# Patient Record
Sex: Male | Born: 1985 | Race: Black or African American | Hispanic: No | Marital: Single | Smoking: Current every day smoker
Health system: Southern US, Community
[De-identification: ages and names within clinical notes are randomized; demographics above are authoritative.]

## PROBLEM LIST (undated history)

## (undated) ENCOUNTER — Emergency Department (HOSPITAL_BASED_OUTPATIENT_CLINIC_OR_DEPARTMENT_OTHER): Payer: Self-pay

## (undated) DIAGNOSIS — R079 Chest pain, unspecified: Secondary | ICD-10-CM

## (undated) DIAGNOSIS — I10 Essential (primary) hypertension: Secondary | ICD-10-CM

## (undated) HISTORY — DX: Chest pain, unspecified: R07.9

## (undated) HISTORY — PX: CIRCUMCISION: SUR203

---

## 2011-01-31 ENCOUNTER — Emergency Department (INDEPENDENT_AMBULATORY_CARE_PROVIDER_SITE_OTHER): Payer: Self-pay

## 2011-01-31 ENCOUNTER — Emergency Department (HOSPITAL_BASED_OUTPATIENT_CLINIC_OR_DEPARTMENT_OTHER)
Admission: EM | Admit: 2011-01-31 | Discharge: 2011-01-31 | Disposition: A | Discharge status: Home / Self Care | Payer: Self-pay | Attending: Emergency Medicine | Admitting: Emergency Medicine

## 2011-01-31 ENCOUNTER — Encounter: Payer: Self-pay | Admitting: *Deleted

## 2011-01-31 DIAGNOSIS — S022XXA Fracture of nasal bones, initial encounter for closed fracture: Secondary | ICD-10-CM | POA: Insufficient documentation

## 2011-01-31 DIAGNOSIS — J3489 Other specified disorders of nose and nasal sinuses: Secondary | ICD-10-CM

## 2011-01-31 DIAGNOSIS — IMO0002 Reserved for concepts with insufficient information to code with codable children: Secondary | ICD-10-CM | POA: Insufficient documentation

## 2011-01-31 DIAGNOSIS — X58XXXA Exposure to other specified factors, initial encounter: Secondary | ICD-10-CM

## 2011-01-31 DIAGNOSIS — F17200 Nicotine dependence, unspecified, uncomplicated: Secondary | ICD-10-CM | POA: Insufficient documentation

## 2011-01-31 NOTE — ED Provider Notes (Addendum)
History     CSN: [id number] Arrival date & time: 01/31/2011  2:01 PM   First MD Initiated Contact with Patient 01/31/11 1426      Chief Complaint  Patient presents with  . Facial Injury    (Consider location/radiation/quality/duration/timing/severity/associated sxs/prior treatment) HPI Patient hit in nose with elbow yesterday.  NO loc.  Patient states nose broken before but now with deformity.  No other injury.   History reviewed. No pertinent past medical history.  Past Surgical History  Procedure Date  . Circumcision     History reviewed. No pertinent family history.  History  Substance Use Topics  . Smoking status: Current Everyday Smoker  . Smokeless tobacco: Not on file  . Alcohol Use: No      Review of Systems  All other systems reviewed and are negative.    Allergies  Review of patient's allergies indicates no known allergies.  Home Medications  No current outpatient prescriptions on file.  BP 134/77  Pulse 84  Temp(Src) 98 F (36.7 C) (Oral)  Resp 18  Ht 5\' 7"  (1.702 m)  Wt 160 lb (72.576 kg)  BMI 25.06 kg/m2  SpO2 98%  Physical Exam  Constitutional: He is oriented to person, place, and time. He appears well-developed and well-nourished.  HENT:  Head: Normocephalic.  Right Ear: External ear normal.  Left Ear: External ear normal.       Deformity to nose, no septal hematomas  Eyes: Conjunctivae and EOM are normal. Pupils are equal, round, and reactive to light.  Neck: Normal range of motion. Neck supple.  Cardiovascular: Normal rate and regular rhythm.   Abdominal: Soft. Bowel sounds are normal.  Musculoskeletal: Normal range of motion.  Neurological: He is alert and oriented to person, place, and time. He has normal reflexes.  Skin: Skin is warm and dry.  Psychiatric: He has a normal mood and affect.    ED Course  Procedures (including critical care time)  Labs Reviewed - No data to display No results found.   No diagnosis  found.    MDM  Patient not in room "out smoking" per staff       Hilario Quarry, MD 01/31/11 1506  Hilario Quarry, MD 01/31/11 [phone]

## 2011-01-31 NOTE — ED Notes (Signed)
Care plan use of ice and tylenol for pain reviewed

## 2011-01-31 NOTE — ED Notes (Signed)
Pt states he got elbowed in the nose yesterday. No LOC. Bled then, but none today. Now c/o increased pain. Obvious deformity noted.

## 2015-11-03 ENCOUNTER — Emergency Department (HOSPITAL_BASED_OUTPATIENT_CLINIC_OR_DEPARTMENT_OTHER): Payer: Self-pay

## 2015-11-03 ENCOUNTER — Encounter (HOSPITAL_BASED_OUTPATIENT_CLINIC_OR_DEPARTMENT_OTHER): Payer: Self-pay | Admitting: Emergency Medicine

## 2015-11-03 ENCOUNTER — Emergency Department (HOSPITAL_BASED_OUTPATIENT_CLINIC_OR_DEPARTMENT_OTHER)
Admission: EM | Admit: 2015-11-03 | Discharge: 2015-11-03 | Disposition: A | Discharge status: Home / Self Care | Payer: Self-pay | Attending: Emergency Medicine | Admitting: Emergency Medicine

## 2015-11-03 DIAGNOSIS — R0789 Other chest pain: Secondary | ICD-10-CM | POA: Insufficient documentation

## 2015-11-03 DIAGNOSIS — F1290 Cannabis use, unspecified, uncomplicated: Secondary | ICD-10-CM | POA: Insufficient documentation

## 2015-11-03 DIAGNOSIS — F17200 Nicotine dependence, unspecified, uncomplicated: Secondary | ICD-10-CM | POA: Insufficient documentation

## 2015-11-03 DIAGNOSIS — I10 Essential (primary) hypertension: Secondary | ICD-10-CM | POA: Insufficient documentation

## 2015-11-03 HISTORY — DX: Essential (primary) hypertension: I10

## 2015-11-03 LAB — CBC
HEMATOCRIT: 43.2 % (ref 39.0–52.0)
HEMOGLOBIN: 15.3 g/dL (ref 13.0–17.0)
MCH: 30.8 pg (ref 26.0–34.0)
MCHC: 35.4 g/dL (ref 30.0–36.0)
MCV: 87.1 fL (ref 78.0–100.0)
Platelets: 197 10*3/uL (ref 150–400)
RBC: 4.96 MIL/uL (ref 4.22–5.81)
RDW: 14.2 % (ref 11.5–15.5)
WBC: 8.5 10*3/uL (ref 4.0–10.5)

## 2015-11-03 LAB — BASIC METABOLIC PANEL
ANION GAP: 8 (ref 5–15)
BUN: 19 mg/dL (ref 6–20)
CALCIUM: 9.1 mg/dL (ref 8.9–10.3)
CHLORIDE: 104 mmol/L (ref 101–111)
CO2: 25 mmol/L (ref 22–32)
Creatinine, Ser: 0.95 mg/dL (ref 0.61–1.24)
GFR calc Af Amer: >60 mL/min (ref >60)
GFR calc non Af Amer: >60 mL/min (ref >60)
GLUCOSE: 95 mg/dL (ref 65–99)
Potassium: 3.6 mmol/L (ref 3.5–5.1)
Sodium: 137 mmol/L (ref 135–145)

## 2015-11-03 LAB — TROPONIN I

## 2015-11-03 NOTE — ED Provider Notes (Addendum)
[facility]   CSN: [id number] Arrival date & time: 11/03/15  [id number]     History   Chief Complaint Chief Complaint  Patient presents with  . Chest Pain    HPI Pedro Hebert is a 30 y.o. male.Complains of left-sided anterior chest pain onset 1 month ago pain is constant with, waxes and wanes, made worse with smoking or with exercise not improved by anything. Presently pain is mild, pleuritic in quality. Nonradiating. Associated symptoms include generalized weakness and shortness of breath. No other associated symptoms. No treatment prior to coming here.  HPI  Past Medical History:  Diagnosis Date  . Hypertension   Denies history of hypertension  There are no active problems to display for this patient.   Past Surgical History:  Procedure Laterality Date  . CIRCUMCISION         Home Medications    Prior to Admission medications   Not on File    Family History No family history on file.  Social History Social History  Substance Use Topics  . Smoking status: Current Every Day Smoker  . Smokeless tobacco: Not on file  . Alcohol use Yes     Comment: occasional   Denies illicit drug use  Allergies   Review of patient's allergies indicates no known allergies.   Review of Systems Review of Systems  Constitutional: Negative.   HENT: Negative.   Respiratory: Positive for shortness of breath.   Cardiovascular: Positive for chest pain.  Gastrointestinal: Negative.   Musculoskeletal: Negative.   Skin: Negative.   Neurological: Positive for light-headedness.  Psychiatric/Behavioral: Negative.   All other systems reviewed and are negative.    Physical Exam Updated Vital Signs BP 118/84   Pulse (!) 51   Temp 97.9 F (36.6 C) (Oral)   Resp 15   Ht 5\' 8"  (1.727 m)   Wt 140 lb (63.5 kg)   SpO2 99%   BMI 21.29 kg/m   Physical Exam  Constitutional: He appears well-developed and well-nourished. No distress.  Bradycardic    HENT:  Head: Normocephalic and atraumatic.  Eyes: Conjunctivae are normal. Pupils are equal, round, and reactive to light.  Neck: Neck supple. No tracheal deviation present. No thyromegaly present.  Cardiovascular: Regular rhythm and intact distal pulses.   No murmur heard. Pulmonary/Chest: Effort normal and breath sounds normal.  Abdominal: Soft. Bowel sounds are normal. He exhibits no distension. There is no tenderness.  Musculoskeletal: Normal range of motion. He exhibits no edema or tenderness.  Neurological: He is alert. Coordination normal.  Skin: Skin is warm and dry. No rash noted.  Psychiatric: He has a normal mood and affect. His behavior is normal.  Nursing note and vitals reviewed.    ED Treatments / Results  Labs (all labs ordered are listed, but only abnormal results are displayed) Labs Reviewed  BASIC METABOLIC PANEL  CBC  TROPONIN I    EKG  EKG Interpretation  Date/Time:  Monday November 03 2015 04:57:23 EDT Ventricular Rate:  50 PR Interval:    QRS Duration: 112 QT Interval:  420 QTC Calculation: 383 R Axis:   14 Text Interpretation:  Sinus rhythm Incomplete right bundle branch block ST elev, probable normal early repol pattern No previous ECGs available Confirmed by Bebe ShaggyWICKLINE  MD, DONALD ([id number]) on 11/03/2015 5:02:31 AM       Radiology Dg Chest 2 View  Result Date: 11/03/2015 CLINICAL DATA:  Chest pain.  Shortness of breath. EXAM: CHEST  2 VIEW COMPARISON:  Chest radiograph 11/04/2014 FINDINGS: Cardiomediastinal contours are normal. No pneumothorax or sizable pleural effusion. No focal airspace consolidation or pulmonary edema. IMPRESSION: Clear lungs. Electronically Signed   By: Deatra RobinsonKevin  Herman M.D.   On: 11/03/2015 05:36    Procedures Procedures (including critical care time)  Medications Ordered in ED Medications - No data to display  Chest x-ray viewed by me. Results for orders placed or performed during the hospital encounter of 11/03/15  Basic  metabolic panel  Result Value Ref Range   Sodium 137 135 - 145 mmol/L   Potassium 3.6 3.5 - 5.1 mmol/L   Chloride 104 101 - 111 mmol/L   CO2 25 22 - 32 mmol/L   Glucose, Bld 95 65 - 99 mg/dL   BUN 19 6 - 20 mg/dL   Creatinine, Ser 1.610.95 0.61 - 1.24 mg/dL   Calcium 9.1 8.9 - 09.610.3 mg/dL   GFR calc non Af Amer >60 >60 mL/min   GFR calc Af Amer >60 >60 mL/min   Anion gap 8 5 - 15  CBC  Result Value Ref Range   WBC 8.5 4.0 - 10.5 K/uL   RBC 4.96 4.22 - 5.81 MIL/uL   Hemoglobin 15.3 13.0 - 17.0 g/dL   HCT 04.543.2 40.939.0 - 81.152.0 %   MCV 87.1 78.0 - 100.0 fL   MCH 30.8 26.0 - 34.0 pg   MCHC 35.4 30.0 - 36.0 g/dL   RDW 91.414.2 78.211.5 - 95.615.5 %   Platelets 197 150 - 400 K/uL  Troponin I  Result Value Ref Range   Troponin I <0.03 <0.03 ng/mL   Dg Chest 2 View  Result Date: 11/03/2015 CLINICAL DATA:  Chest pain.  Shortness of breath. EXAM: CHEST  2 VIEW COMPARISON:  Chest radiograph 11/04/2014 FINDINGS: Cardiomediastinal contours are normal. No pneumothorax or sizable pleural effusion. No focal airspace consolidation or pulmonary edema. IMPRESSION: Clear lungs. Electronically Signed   By: Deatra RobinsonKevin  Herman M.D.   On: 11/03/2015 05:36   Initial Impression / Assessment and Plan / ED Course  I have reviewed the triage vital signs and the nursing notes.  Pertinent labs & imaging results that were available during my care of the patient were reviewed by me and considered in my medical decision making (see chart for details).  Clinical Course   Heart score equals 2. Doubt acute coronary syndrome with atypical symptoms, one month of symptoms, constant. Symptoms highly atypical for aortic dissection. Symmetric bilateral radial pulses. Normal chest x-ray. Doubt pulmonary embolism. Atypical symptoms after one month. Counseled patient for 5 minutes on smoking cessation plan referral [facility].   Final Clinical Impressions(s) / ED Diagnoses  Diagnosis atypical  chest pain Final diagnoses:  Atypical chest pain    New Prescriptions New Prescriptions   No medications on file     Doug SouSam Bobbi Yount, MD 11/03/15 [id number]    Doug SouSam Taylor Levick, MD 11/03/15 [phone]

## 2015-11-03 NOTE — ED Triage Notes (Signed)
Pt states chest pain intermittently x 1 month. Pt is a smoker. Shortness of breath, lightheadedness and weakness associated with chest pain. Pain 8-9/10 on arrival.

## 2015-11-03 NOTE — Discharge Instructions (Signed)
Call the [facility] today to arrange to get a primary care physician. Ask your new doctor to help you to stop smoking. Call the [facility] for an appointment if you wish to see a cardiologist. Take Tylenol or Advil as directed for pain

## 2015-12-09 ENCOUNTER — Emergency Department (HOSPITAL_BASED_OUTPATIENT_CLINIC_OR_DEPARTMENT_OTHER): Payer: Self-pay

## 2015-12-09 ENCOUNTER — Emergency Department (HOSPITAL_BASED_OUTPATIENT_CLINIC_OR_DEPARTMENT_OTHER)
Admission: EM | Admit: 2015-12-09 | Discharge: 2015-12-09 | Disposition: A | Discharge status: Home / Self Care | Payer: Self-pay | Attending: Emergency Medicine | Admitting: Emergency Medicine

## 2015-12-09 ENCOUNTER — Encounter (HOSPITAL_BASED_OUTPATIENT_CLINIC_OR_DEPARTMENT_OTHER): Payer: Self-pay | Admitting: Emergency Medicine

## 2015-12-09 ENCOUNTER — Ambulatory Visit: Payer: Self-pay | Admitting: Interventional Cardiology

## 2015-12-09 DIAGNOSIS — F17200 Nicotine dependence, unspecified, uncomplicated: Secondary | ICD-10-CM | POA: Insufficient documentation

## 2015-12-09 DIAGNOSIS — M79672 Pain in left foot: Secondary | ICD-10-CM | POA: Insufficient documentation

## 2015-12-09 DIAGNOSIS — I10 Essential (primary) hypertension: Secondary | ICD-10-CM | POA: Insufficient documentation

## 2015-12-09 MED ORDER — IBUPROFEN 400 MG PO TABS
600.0000 mg | ORAL_TABLET | Freq: Once | ORAL | Status: AC
  Administered 2015-12-09: 600 mg via ORAL
  Filled 2015-12-09: qty 1

## 2015-12-09 NOTE — ED Triage Notes (Signed)
Pt c/o left foot pain since this morning, denies any injury

## 2015-12-09 NOTE — ED Provider Notes (Signed)
[facility]   CSN: [id number] Arrival date & time: 12/09/15  1431     History   Chief Complaint Chief Complaint  Patient presents with  . Foot Pain    HPI Pedro Hebert is a 30 y.o. male.  HPI  30 year old male presents with atraumatic left foot pain. Woke up with foot pain yesterday morning. The day before he did not remember doing anything out of the ordinary or any type of exercise or anything that could've injured him. The pain is in the plantar aspect of his foot. It is distal to his heel. Patient has not had any numbness or weakness. He has tried ibuprofen and Advil without relief. Has also tried ice and hot packs. When he palpates the plantar aspect of his foot it feels swollen to him. No ankle pain or swelling.  Past Medical History:  Diagnosis Date  . Chest pain   . Hypertension     There are no active problems to display for this patient.   Past Surgical History:  Procedure Laterality Date  . CIRCUMCISION         Home Medications    Prior to Admission medications   Not on File    Family History Family History  Problem Relation Age of Onset  . Other Maternal Grandfather     LEG  AMBULATION    Social History Social History  Substance Use Topics  . Smoking status: Current Every Day Smoker  . Smokeless tobacco: Never Used  . Alcohol use Yes     Comment: occasional, DRINK WINE ,BEER AND LIQUOR     Allergies   Review of patient's allergies indicates no known allergies.   Review of Systems Review of Systems  Musculoskeletal: Positive for arthralgias and joint swelling.  Skin: Negative for color change and wound.  Neurological: Negative for weakness and numbness.  All other systems reviewed and are negative.    Physical Exam Updated Vital Signs BP 99/80 (BP Location: Right Arm)   Pulse 88   Temp 98.1 F (36.7 C) (Oral)   Resp 18   Ht 5\' 8"  (1.727 m)   Wt 145 lb (65.8 kg)   SpO2 100%   BMI 22.05 kg/m    Physical Exam  Constitutional: He is oriented to person, place, and time. He appears well-developed and well-nourished. No distress.  HENT:  Head: Normocephalic and atraumatic.  Right Ear: External ear normal.  Left Ear: External ear normal.  Nose: Nose normal.  Eyes: Right eye exhibits no discharge. Left eye exhibits no discharge.  Cardiovascular: Normal rate and regular rhythm.   Pulses:      Dorsalis pedis pulses are 2+ on the left side.  Pulmonary/Chest: Effort normal.  Abdominal: He exhibits no distension.  Musculoskeletal: He exhibits no edema.       Left ankle: He exhibits normal range of motion. No tenderness.       Left foot: There is tenderness. There is no bony tenderness and no swelling.       Feet:  Neurological: He is alert and oriented to person, place, and time.  Skin: Skin is warm and dry. He is not diaphoretic.  Nursing note and vitals reviewed.    ED Treatments / Results  Labs (all labs ordered are listed, but only abnormal results are displayed) Labs Reviewed - No data to display  EKG  EKG Interpretation None       Radiology Dg Foot Complete Left  Result Date: 12/09/2015 CLINICAL DATA:  Plantar pain since yesterday EXAM: LEFT FOOT - COMPLETE 3+ VIEW COMPARISON:  None. FINDINGS: There is no evidence of fracture or dislocation. No calcaneal spur. There is no evidence of arthropathy or other focal bone abnormality. Soft tissues are unremarkable. IMPRESSION: Negative. Electronically Signed   By: Corlis Leak M.D.   On: 12/09/2015 15:26    Procedures Procedures (including critical care time)  Medications Ordered in ED Medications  ibuprofen (ADVIL,MOTRIN) tablet 600 mg (600 mg Oral Given 12/09/15 1527)     Initial Impression / Assessment and Plan / ED Course  I have reviewed the triage vital signs and the nursing notes.  Pertinent labs & imaging results that were available during my care of the patient were reviewed by me and considered in my  medical decision making (see chart for details).  Clinical Course    Xray benign. NV intact. Tendon/ligament issue? I do not feel obvious infection/abscess. Maybe plantar fascitis. Will place in post-op shoe, continue NSAIDs, tylenol. F/u with Dr. Pearletha Forge.   Final Clinical Impressions(s) / ED Diagnoses   Final diagnoses:  Acute foot pain, left    New Prescriptions New Prescriptions   No medications on file     Pricilla Loveless, MD 12/09/15 1559

## 2015-12-09 NOTE — ED Notes (Signed)
Post op shoe placed on left foot

## 2016-01-24 ENCOUNTER — Emergency Department (HOSPITAL_BASED_OUTPATIENT_CLINIC_OR_DEPARTMENT_OTHER)
Admission: EM | Admit: 2016-01-24 | Discharge: 2016-01-24 | Disposition: A | Discharge status: Home / Self Care | Payer: Self-pay | Attending: Emergency Medicine | Admitting: Emergency Medicine

## 2016-01-24 ENCOUNTER — Encounter (HOSPITAL_BASED_OUTPATIENT_CLINIC_OR_DEPARTMENT_OTHER): Payer: Self-pay | Admitting: *Deleted

## 2016-01-24 ENCOUNTER — Emergency Department (HOSPITAL_BASED_OUTPATIENT_CLINIC_OR_DEPARTMENT_OTHER): Payer: Self-pay

## 2016-01-24 DIAGNOSIS — F17200 Nicotine dependence, unspecified, uncomplicated: Secondary | ICD-10-CM | POA: Insufficient documentation

## 2016-01-24 DIAGNOSIS — R059 Cough, unspecified: Secondary | ICD-10-CM

## 2016-01-24 DIAGNOSIS — I10 Essential (primary) hypertension: Secondary | ICD-10-CM | POA: Insufficient documentation

## 2016-01-24 DIAGNOSIS — R0982 Postnasal drip: Secondary | ICD-10-CM

## 2016-01-24 DIAGNOSIS — R05 Cough: Secondary | ICD-10-CM | POA: Insufficient documentation

## 2016-01-24 MED ORDER — GUAIFENESIN ER 600 MG PO TB12: 600.0000 mg | ORAL_TABLET | Freq: Two times a day (BID) | ORAL | 0 refills | Status: DC

## 2016-01-24 MED ORDER — BENZONATATE 100 MG PO CAPS
200.0000 mg | ORAL_CAPSULE | Freq: Once | ORAL | Status: AC
  Administered 2016-01-24: 200 mg via ORAL
  Filled 2016-01-24: qty 2

## 2016-01-24 MED ORDER — BENZONATATE 100 MG PO CAPS: 100.0000 mg | ORAL_CAPSULE | Freq: Three times a day (TID) | ORAL | 0 refills | Status: DC

## 2016-01-24 MED ORDER — ALBUTEROL SULFATE HFA 108 (90 BASE) MCG/ACT IN AERS: 1.0000 | INHALATION_SPRAY | Freq: Four times a day (QID) | RESPIRATORY_TRACT | 0 refills | Status: DC | PRN

## 2016-01-24 MED ORDER — PREDNISONE 20 MG PO TABS: ORAL_TABLET | ORAL | 0 refills | Status: DC

## 2016-01-24 MED ORDER — PREDNISONE 50 MG PO TABS
60.0000 mg | ORAL_TABLET | Freq: Once | ORAL | Status: AC
  Administered 2016-01-24: 60 mg via ORAL
  Filled 2016-01-24: qty 1

## 2016-01-24 MED ORDER — GUAIFENESIN 100 MG/5ML PO SOLN
5.0000 mL | Freq: Once | ORAL | Status: AC
  Administered 2016-01-24: 100 mg via ORAL
  Filled 2016-01-24: qty 5

## 2016-01-24 NOTE — ED Triage Notes (Signed)
C/o productive x 4 weeks w cp w cough

## 2016-01-24 NOTE — ED Provider Notes (Signed)
[facility]   CSN: [id number] Arrival date & time: 01/24/16  0433     History   Chief Complaint Chief Complaint  Patient presents with  . Cough    HPI Pedro Hebert is a 30 y.o. male.  The history is provided by the patient.  Cough  This is a new problem. The current episode started more than 1 week ago. Episode frequency: intermittently. The problem has not changed since onset.There has been no fever. Associated symptoms include rhinorrhea. Pertinent negatives include no chest pain, no chills, no sweats, no weight loss, no headaches, no sore throat, no myalgias, no shortness of breath and no wheezing. Treatments tried: dayquil. The treatment provided no relief. He is a smoker.    Past Medical History:  Diagnosis Date  . Chest pain   . Hypertension     There are no active problems to display for this patient.   Past Surgical History:  Procedure Laterality Date  . CIRCUMCISION         Home Medications    Prior to Admission medications   Not on File    Family History Family History  Problem Relation Age of Onset  . Other Maternal Grandfather     LEG  AMBULATION    Social History Social History  Substance Use Topics  . Smoking status: Current Every Day Smoker  . Smokeless tobacco: Never Used  . Alcohol use Yes     Comment: occasional, DRINK WINE ,BEER AND LIQUOR     Allergies   Patient has no known allergies.   Review of Systems Review of Systems  Constitutional: Negative for chills, fever and weight loss.  HENT: Positive for congestion and rhinorrhea. Negative for sore throat.   Respiratory: Positive for cough. Negative for shortness of breath and wheezing.   Cardiovascular: Negative for chest pain, palpitations and leg swelling.  Gastrointestinal: Negative for abdominal pain.  Musculoskeletal: Negative for myalgias.  Neurological: Negative for headaches.  All other systems reviewed and are  negative.    Physical Exam Updated Vital Signs BP 114/69 (BP Location: Right Arm)   Pulse 61   Temp 97.5 F (36.4 C) (Oral)   Resp 20   Ht 5\' 8"  (1.727 m)   Wt 145 lb (65.8 kg)   SpO2 98%   BMI 22.05 kg/m   Physical Exam  Constitutional: He is oriented to person, place, and time. He appears well-developed and well-nourished. No distress.  HENT:  Head: Normocephalic and atraumatic.  Right Ear: External ear normal.  Left Ear: External ear normal.  Clear colorless postnasal drip  Eyes: EOM are normal. Pupils are equal, round, and reactive to light.  Neck: Normal range of motion. Neck supple. No JVD present.  Cardiovascular: Normal rate, regular rhythm and intact distal pulses.   Pulmonary/Chest: Effort normal and breath sounds normal. No stridor. No respiratory distress. He has no wheezes. He has no rales.  Abdominal: Soft. Bowel sounds are normal. He exhibits no mass. There is no tenderness. There is no rebound and no guarding.  Musculoskeletal: Normal range of motion.  Neurological: He is alert and oriented to person, place, and time.  Skin: Skin is warm and dry. Capillary refill takes less than 2 seconds.  Psychiatric: He has a normal mood and affect.     ED Treatments / Results   Radiology No results found.  Procedures Procedures (including critical care time)  Medications Ordered in ED Medications  benzonatate (TESSALON) capsule 200 mg (not administered)  predniSONE (DELTASONE) tablet 60 mg (not administered)  guaiFENesin (ROBITUSSIN) 100 MG/5ML solution 100 mg (not administered)    Final Clinical Impressions(s) / ED Diagnoses   Lungs are clear, vitals are normal.  Some of cough may be post nasal drip but the majority is related to the patient's smoking.  He has been counseled to stop this.  We will treat symptomatically. All questions answered to patient's satisfaction. Based on history and exam patient has been appropriately medically screened and emergency  conditions excluded. Patient is stable for discharge at this time. Follow up with your PMD for recheck in 2 days and strict return precautions given.  New Prescriptions New Prescriptions   No medications on file     Dhani Imel, MD 01/24/16 0502

## 2020-07-26 ENCOUNTER — Other Ambulatory Visit: Payer: Self-pay

## 2020-07-26 ENCOUNTER — Encounter (HOSPITAL_BASED_OUTPATIENT_CLINIC_OR_DEPARTMENT_OTHER): Payer: Self-pay

## 2020-07-26 DIAGNOSIS — I10 Essential (primary) hypertension: Secondary | ICD-10-CM | POA: Insufficient documentation

## 2020-07-26 DIAGNOSIS — F17200 Nicotine dependence, unspecified, uncomplicated: Secondary | ICD-10-CM | POA: Insufficient documentation

## 2020-07-26 DIAGNOSIS — K0889 Other specified disorders of teeth and supporting structures: Secondary | ICD-10-CM | POA: Insufficient documentation

## 2020-07-26 NOTE — ED Triage Notes (Signed)
Reports left bottom dental pain x3 days. States the whole left side of his face and neck hurt. Excedrin this am, advil pm at 1800.

## 2020-07-27 ENCOUNTER — Emergency Department (HOSPITAL_BASED_OUTPATIENT_CLINIC_OR_DEPARTMENT_OTHER)
Admission: EM | Admit: 2020-07-27 | Discharge: 2020-07-27 | Disposition: A | Discharge status: Home / Self Care | Payer: Self-pay | Attending: Emergency Medicine | Admitting: Emergency Medicine

## 2020-07-27 DIAGNOSIS — K0889 Other specified disorders of teeth and supporting structures: Secondary | ICD-10-CM

## 2020-07-27 MED ORDER — AMOXICILLIN 500 MG PO CAPS
500.0000 mg | ORAL_CAPSULE | Freq: Once | ORAL | Status: AC
  Administered 2020-07-27: 500 mg via ORAL
  Filled 2020-07-27: qty 1

## 2020-07-27 MED ORDER — AMOXICILLIN 500 MG PO CAPS: 500.0000 mg | ORAL_CAPSULE | Freq: Three times a day (TID) | ORAL | 0 refills | Status: AC

## 2020-07-27 MED ORDER — OXYCODONE-ACETAMINOPHEN 5-325 MG PO TABS: 1.0000 | ORAL_TABLET | Freq: Four times a day (QID) | ORAL | 0 refills | Status: DC | PRN

## 2020-07-27 MED ORDER — BUPIVACAINE-EPINEPHRINE (PF) 0.5% -1:200000 IJ SOLN
1.8000 mL | Freq: Once | INTRAMUSCULAR | Status: AC
  Administered 2020-07-27: 1.8 mL
  Filled 2020-07-27: qty 1.8

## 2020-07-27 NOTE — ED Provider Notes (Signed)
[facility] Provider Note: Lowella Dell, MD, FACEP  CSN: [id number] MRN: [id number] ARRIVAL: 07/26/20 at 2306 ROOM: [facility]01/[facility]01   CHIEF COMPLAINT  Dental Pain   HISTORY OF PRESENT ILLNESS  07/27/20 2:02 AM Pedro Hebert is a 35 y.o. male who has a fractured left lower first molar.  He developed pain in that tooth 3 days ago which has become severe.  He rates the pain as an 8 out of 10 and characterizes it as throbbing.  It is worse with eating or drinking.  He has taken Excedrin and Advil without relief.  Past Medical History:  Diagnosis Date  . Chest pain   . Hypertension     Past Surgical History:  Procedure Laterality Date  . CIRCUMCISION      Family History  Problem Relation Age of Onset  . Other Maternal Grandfather        LEG  AMBULATION    Social History   Tobacco Use  . Smoking status: Current Every Day Smoker  . Smokeless tobacco: Never Used  Substance Use Topics  . Alcohol use: Yes    Comment: occasional, DRINK WINE ,BEER AND LIQUOR  . Drug use: Yes    Types: Marijuana    Prior to Admission medications   Medication Sig Start Date End Date Taking? Authorizing Provider  amoxicillin (AMOXIL) 500 MG capsule Take 1 capsule (500 mg total) by mouth 3 (three) times daily. 07/27/20  Yes Clotiel Troop, MD  oxyCODONE-acetaminophen (PERCOCET) 5-325 MG tablet Take 1 tablet by mouth every 6 (six) hours as needed for severe pain. 07/27/20  Yes Geordie Nooney, MD  albuterol (PROVENTIL HFA;VENTOLIN HFA) 108 (90 Base) MCG/ACT inhaler Inhale 1-2 puffs into the lungs every 6 (six) hours as needed for wheezing or shortness of breath. 01/24/16 07/27/20  Palumbo, April, MD    Allergies Patient has no known allergies.   REVIEW OF SYSTEMS  Negative except as noted here or in the History of Present Illness.   PHYSICAL EXAMINATION  Initial Vital Signs Blood pressure (!) 139/104, pulse (!) 54, temperature 99.2 F (37.3 C), temperature source Oral, resp. rate 16,  height 5\' 7"  (1.702 m), weight 63.5 kg, SpO2 99 %.  Examination General: Well-developed, well-nourished male in obvious discomfort; appearance consistent with age of record HENT: normocephalic; atraumatic; fractured/carious left lower first molar with tenderness to percussion Eyes: pupils equal, round and reactive to light; extraocular muscles intact Neck: supple; mild left anterior cervical lymphadenopathy Heart: regular rate and rhythm Lungs: clear to auscultation bilaterally Abdomen: soft; nondistended; nontender; bowel sounds present Extremities: No deformity; full range of motion Neurologic: Awake, alert and oriented; motor function intact in all extremities and symmetric; no facial droop Skin: Warm and dry Psychiatric: Pacing; moaning   RESULTS  Summary of this visit's results, reviewed and interpreted by myself:   EKG Interpretation  Date/Time:    Ventricular Rate:    PR Interval:    QRS Duration:   QT Interval:    QTC Calculation:   R Axis:     Text Interpretation:        Laboratory Studies: No results found for this or any previous visit (from the past 24 hour(s)). Imaging Studies: No results found.  ED COURSE and MDM  Nursing notes, initial and subsequent vitals signs, including pulse oximetry, reviewed and interpreted by myself.  Vitals:   07/26/20 2320 07/26/20 2324  BP:  (!) 139/104  Pulse:  (!) 54  Resp:  16  Temp:  99.2 F (37.3  C)  TempSrc:  Oral  SpO2:  99%  Weight: 63.5 kg   Height: 5\' 7"  (1.702 m)    Medications  bupivacaine-epinephrine (MARCAINE W/ EPI) 0.5% -1:200000 injection 1.8 mL (has no administration in time range)  amoxicillin (AMOXIL) capsule 500 mg (has no administration in time range)    Will start patient on antibiotics and analgesics and refer to dentistry.  PROCEDURES  Procedures DENTAL BLOCK 1.8 milliliters of 0.5% bupivacaine with epinephrine were injected into the buccal fold adjacent to the left lower first molar. The  patient tolerated this well and there were no immediate complications.  Patient states adequate analgesia was not obtained.   ED DIAGNOSES     ICD-10-CM   1. Pain, dental  K08.89        , MD 07/27/20 [phone]

## 2020-09-29 ENCOUNTER — Other Ambulatory Visit: Payer: Self-pay

## 2020-09-29 ENCOUNTER — Encounter (HOSPITAL_BASED_OUTPATIENT_CLINIC_OR_DEPARTMENT_OTHER): Payer: Self-pay | Admitting: Emergency Medicine

## 2020-09-29 ENCOUNTER — Emergency Department (HOSPITAL_BASED_OUTPATIENT_CLINIC_OR_DEPARTMENT_OTHER)
Admission: EM | Admit: 2020-09-29 | Discharge: 2020-09-29 | Disposition: A | Discharge status: Home / Self Care | Payer: Self-pay | Attending: Emergency Medicine | Admitting: Emergency Medicine

## 2020-09-29 DIAGNOSIS — K029 Dental caries, unspecified: Secondary | ICD-10-CM | POA: Insufficient documentation

## 2020-09-29 DIAGNOSIS — F17200 Nicotine dependence, unspecified, uncomplicated: Secondary | ICD-10-CM | POA: Insufficient documentation

## 2020-09-29 DIAGNOSIS — I10 Essential (primary) hypertension: Secondary | ICD-10-CM | POA: Insufficient documentation

## 2020-09-29 MED ORDER — OXYCODONE-ACETAMINOPHEN 5-325 MG PO TABS
2.0000 | ORAL_TABLET | Freq: Once | ORAL | Status: DC
  Filled 2020-09-29: qty 2

## 2020-09-29 MED ORDER — CLINDAMYCIN HCL 150 MG PO CAPS
300.0000 mg | ORAL_CAPSULE | Freq: Once | ORAL | Status: AC
  Administered 2020-09-29: 300 mg via ORAL
  Filled 2020-09-29: qty 2

## 2020-09-29 MED ORDER — CLINDAMYCIN HCL 300 MG PO CAPS: 300.0000 mg | ORAL_CAPSULE | Freq: Four times a day (QID) | ORAL | 0 refills | Status: AC

## 2020-09-29 MED ORDER — OXYCODONE-ACETAMINOPHEN 5-325 MG PO TABS: 1.0000 | ORAL_TABLET | Freq: Four times a day (QID) | ORAL | 0 refills | Status: AC | PRN

## 2020-09-29 NOTE — ED Provider Notes (Signed)
[facility] EMERGENCY DEPARTMENT Provider Note   CSN: [id number] Arrival date & time: 09/29/20  0344     History Chief Complaint  Patient presents with   Dental Pain    Pedro Hebert is a 35 y.o. male.  Patient is a 35 year old male with history of hypertension.  He presents today with complaints of dental pain.  Patient's right lower rear molar began hurting 2 days ago.  He describes severe pain that is unrelieved with ibuprofen.  The history is provided by the patient.  Dental Pain Location:  Lower Lower teeth location:  32/RL 3rd molar and 31/RL 2nd molar Quality:  Throbbing Severity:  Severe Onset quality:  Sudden Duration:  2 days Timing:  Constant Progression:  Worsening Chronicity:  Recurrent Context: dental caries   Context: not abscess       Past Medical History:  Diagnosis Date   Chest pain    Hypertension     There are no problems to display for this patient.   Past Surgical History:  Procedure Laterality Date   CIRCUMCISION         Family History  Problem Relation Age of Onset   Other Maternal Grandfather        LEG  AMBULATION    Social History   Tobacco Use   Smoking status: Every Day   Smokeless tobacco: Never  Substance Use Topics   Alcohol use: Yes    Comment: occasional, DRINK WINE ,BEER AND LIQUOR   Drug use: Yes    Types: Marijuana    Home Medications Prior to Admission medications   Medication Sig Start Date End Date Taking? Authorizing Provider  amoxicillin (AMOXIL) 500 MG capsule Take 1 capsule (500 mg total) by mouth 3 (three) times daily. 07/27/20   Molpus, John, MD  oxyCODONE-acetaminophen (PERCOCET) 5-325 MG tablet Take 1 tablet by mouth every 6 (six) hours as needed for severe pain. 07/27/20   Molpus, John, MD  albuterol (PROVENTIL HFA;VENTOLIN HFA) 108 (90 Base) MCG/ACT inhaler Inhale 1-2 puffs into the lungs every 6 (six) hours as needed for wheezing or shortness of breath. 01/24/16 07/27/20  Palumbo,  April, MD    Allergies    Patient has no known allergies.  Review of Systems   Review of Systems  All other systems reviewed and are negative.  Physical Exam Updated Vital Signs BP (!) 157/112 (BP Location: Right Arm)   Pulse 81   Temp 98.3 F (36.8 C) (Oral)   Resp 20   Ht 5\' 7"  (1.702 m)   Wt 65.8 kg   SpO2 100%   BMI 22.71 kg/m   Physical Exam Vitals and nursing note reviewed.  Constitutional:      General: He is not in acute distress.    Appearance: Normal appearance. He is not ill-appearing.  HENT:     Head: Normocephalic and atraumatic.     Mouth/Throat:     Comments: The right lower second and third molars have caries.  There is surrounding gingival inflammation, but no obvious abscess. Pulmonary:     Effort: Pulmonary effort is normal.  Musculoskeletal:     Cervical back: Normal range of motion and neck supple. No rigidity or tenderness.  Lymphadenopathy:     Cervical: No cervical adenopathy.  Skin:    General: Skin is warm and dry.  Neurological:     Mental Status: He is alert.    ED Results / Procedures / Treatments   Labs (all labs ordered are listed, but only  abnormal results are displayed) Labs Reviewed - No data to display  EKG None  Radiology No results found.  Procedures Procedures   Medications Ordered in ED Medications  oxyCODONE-acetaminophen (PERCOCET/ROXICET) 5-325 MG per tablet 2 tablet (has no administration in time range)  clindamycin (CLEOCIN) capsule 300 mg (has no administration in time range)    ED Course  I have reviewed the triage vital signs and the nursing notes.  Pertinent labs & imaging results that were available during my care of the patient were reviewed by me and considered in my medical decision making (see chart for details).    MDM Rules/Calculators/A&P  Patient to be treated with clindamycin and short course of pain medication.  Will refer to dentistry.  Final Clinical Impression(s) / ED  Diagnoses Final diagnoses:  None    Rx / DC Orders ED Discharge Orders     None        Geoffery Lyons, MD 09/29/20 0403

## 2020-09-29 NOTE — ED Triage Notes (Signed)
Pt states intermittent dental pain on R side x 1 month. This episode started yesterday morning. Pt states known cracked tooth but no dentist. Pt drove himself to ED. Denies swelling to face or fevers.

## 2020-09-29 NOTE — ED Notes (Signed)
Pt unable to get ride, therefore declined pain medication, says he will pick up his prescribed pain medication; EDP aware

## 2020-09-29 NOTE — Discharge Instructions (Signed)
Begin taking clindamycin as prescribed and Percocet as prescribed as needed for pain.  Follow-up with dentistry.  The contact information for Dr. Mia Creek has been provided in this discharge summary for you to call and make these arrangements.

## 2020-09-29 NOTE — ED Notes (Addendum)
See EDP assessment 

## 2021-03-02 ENCOUNTER — Other Ambulatory Visit: Payer: Self-pay

## 2021-03-02 ENCOUNTER — Emergency Department (HOSPITAL_BASED_OUTPATIENT_CLINIC_OR_DEPARTMENT_OTHER)
Admission: EM | Admit: 2021-03-02 | Discharge: 2021-03-02 | Disposition: A | Discharge status: Home / Self Care | Payer: Self-pay | Attending: Emergency Medicine | Admitting: Emergency Medicine

## 2021-03-02 ENCOUNTER — Encounter (HOSPITAL_BASED_OUTPATIENT_CLINIC_OR_DEPARTMENT_OTHER): Payer: Self-pay

## 2021-03-02 DIAGNOSIS — R112 Nausea with vomiting, unspecified: Secondary | ICD-10-CM

## 2021-03-02 DIAGNOSIS — R1110 Vomiting, unspecified: Secondary | ICD-10-CM | POA: Insufficient documentation

## 2021-03-02 DIAGNOSIS — I10 Essential (primary) hypertension: Secondary | ICD-10-CM | POA: Insufficient documentation

## 2021-03-02 DIAGNOSIS — R1013 Epigastric pain: Secondary | ICD-10-CM | POA: Insufficient documentation

## 2021-03-02 DIAGNOSIS — F17200 Nicotine dependence, unspecified, uncomplicated: Secondary | ICD-10-CM | POA: Insufficient documentation

## 2021-03-02 MED ORDER — ONDANSETRON HCL 4 MG/2ML IJ SOLN
4.0000 mg | Freq: Once | INTRAMUSCULAR | Status: DC
  Filled 2021-03-02: qty 2

## 2021-03-02 MED ORDER — ONDANSETRON 4 MG PO TBDP
4.0000 mg | ORAL_TABLET | Freq: Once | ORAL | Status: AC
  Administered 2021-03-02: 20:00:00 4 mg via ORAL
  Filled 2021-03-02: qty 1

## 2021-03-02 MED ORDER — ONDANSETRON 4 MG PO TBDP: ORAL_TABLET | ORAL | 0 refills | Status: AC

## 2021-03-02 MED ORDER — SODIUM CHLORIDE 0.9 % IV BOLUS: 1000.0000 mL | Freq: Once | INTRAVENOUS | Status: DC

## 2021-03-02 NOTE — Discharge Instructions (Signed)
Take zofran for nausea.   See your doctor for follow up   Return to ER if you have worse abdominal pain, vomiting, fever

## 2021-03-02 NOTE — ED Triage Notes (Signed)
Pt states he vomited x 2 today at work and was set home-NAD-steady gait

## 2021-03-02 NOTE — ED Provider Notes (Signed)
[facility] EMERGENCY DEPARTMENT Provider Note   CSN: [id number] Arrival date & time: 03/02/21  1716     History Chief Complaint  Patient presents with   Vomiting    Pedro Hebert is a 35 y.o. male history of hypertension here presenting with vomiting.  Patient vomited twice when he was at work.  He was sent home.  He was told to come and be evaluated.  Has not eaten anything since he vomited.  Denies any fever.  Denies any sick contacts  The history is provided by the patient.      Past Medical History:  Diagnosis Date   Chest pain    Hypertension     There are no problems to display for this patient.   Past Surgical History:  Procedure Laterality Date   CIRCUMCISION         Family History  Problem Relation Age of Onset   Other Maternal Grandfather        LEG  AMBULATION    Social History   Tobacco Use   Smoking status: Every Day   Smokeless tobacco: Never  Vaping Use   Vaping Use: Never used  Substance Use Topics   Alcohol use: Yes    Comment: occ   Drug use: Not Currently    Types: Marijuana    Home Medications Prior to Admission medications   Medication Sig Start Date End Date Taking? Authorizing Provider  amoxicillin (AMOXIL) 500 MG capsule Take 1 capsule (500 mg total) by mouth 3 (three) times daily. 07/27/20   Molpus, John, MD  clindamycin (CLEOCIN) 300 MG capsule Take 1 capsule (300 mg total) by mouth 4 (four) times daily. X 7 days 09/29/20   Geoffery Lyons, MD  oxyCODONE-acetaminophen (PERCOCET) 5-325 MG tablet Take 1-2 tablets by mouth every 6 (six) hours as needed. 09/29/20   Geoffery Lyons, MD  albuterol (PROVENTIL HFA;VENTOLIN HFA) 108 (90 Base) MCG/ACT inhaler Inhale 1-2 puffs into the lungs every 6 (six) hours as needed for wheezing or shortness of breath. 01/24/16 07/27/20  Palumbo, April, MD    Allergies    Patient has no known allergies.  Review of Systems   Review of Systems  Gastrointestinal:  Positive for abdominal pain  and vomiting.  All other systems reviewed and are negative.  Physical Exam Updated Vital Signs BP 128/87 (BP Location: Left Arm)    Pulse 64    Temp 98 F (36.7 C) (Oral)    Resp 18    Ht 5\' 7"  (1.702 m)    Wt 67.6 kg    SpO2 100%    BMI 23.34 kg/m   Physical Exam Vitals and nursing note reviewed.  Constitutional:      Appearance: Normal appearance.  HENT:     Head: Normocephalic.     Nose: Nose normal.     Mouth/Throat:     Mouth: Mucous membranes are dry.  Eyes:     Extraocular Movements: Extraocular movements intact.     Pupils: Pupils are equal, round, and reactive to light.  Cardiovascular:     Rate and Rhythm: Normal rate and regular rhythm.     Pulses: Normal pulses.     Heart sounds: Normal heart sounds.  Pulmonary:     Effort: Pulmonary effort is normal.     Breath sounds: Normal breath sounds.  Abdominal:     General: Abdomen is flat.     Comments: Minimal epigastric tenderness  Musculoskeletal:        General: Normal  range of motion.     Cervical back: Normal range of motion and neck supple.  Skin:    General: Skin is warm.     Capillary Refill: Capillary refill takes less than 2 seconds.  Neurological:     General: No focal deficit present.     Mental Status: He is alert.  Psychiatric:        Mood and Affect: Mood normal.        Behavior: Behavior normal.    ED Results / Procedures / Treatments   Labs (all labs ordered are listed, but only abnormal results are displayed) Labs Reviewed - No data to display  EKG None  Radiology No results found.  Procedures Procedures   Medications Ordered in ED Medications  ondansetron (ZOFRAN-ODT) disintegrating tablet 4 mg (4 mg Oral Given 03/02/21 1938)    ED Course  I have reviewed the triage vital signs and the nursing notes.  Pertinent labs & imaging results that were available during my care of the patient were reviewed by me and considered in my medical decision making (see chart for details).     MDM Rules/Calculators/A&P                         Pedro Hebert is a 35 y.o. male here presenting with vomiting.  Likely viral gastroenteritis.  Ordered labs initially.  However patient wants to try Zofran and does not want to be stuck for labs. Will p.o. trial afterwards and reassess  8:21 PM Patient tolerated p.o. afterwards.  No epigastric tenderness now.  Likely viral gastro.  Stable for discharge     Final Clinical Impression(s) / ED Diagnoses Final diagnoses:  None    Rx / DC Orders ED Discharge Orders     None        Charlynne Pander, MD 03/02/21 2021

## 2021-04-05 ENCOUNTER — Other Ambulatory Visit: Payer: Self-pay

## 2021-04-05 ENCOUNTER — Emergency Department (HOSPITAL_BASED_OUTPATIENT_CLINIC_OR_DEPARTMENT_OTHER)
Admission: EM | Admit: 2021-04-05 | Discharge: 2021-04-05 | Disposition: A | Discharge status: Home / Self Care | Payer: BC Managed Care – PPO | Attending: Emergency Medicine | Admitting: Emergency Medicine

## 2021-04-05 DIAGNOSIS — I10 Essential (primary) hypertension: Secondary | ICD-10-CM | POA: Diagnosis not present

## 2021-04-05 DIAGNOSIS — J029 Acute pharyngitis, unspecified: Secondary | ICD-10-CM | POA: Diagnosis present

## 2021-04-05 DIAGNOSIS — Z20822 Contact with and (suspected) exposure to covid-19: Secondary | ICD-10-CM | POA: Insufficient documentation

## 2021-04-05 LAB — RESP PANEL BY RT-PCR (FLU A&B, COVID) ARPGX2
Influenza A by PCR: NEGATIVE
Influenza B by PCR: NEGATIVE
SARS Coronavirus 2 by RT PCR: NEGATIVE

## 2021-04-05 LAB — GROUP A STREP BY PCR: Group A Strep by PCR: NOT DETECTED

## 2021-04-05 NOTE — ED Triage Notes (Signed)
Pt came in with c/o sore throat starting last week. States it seemed like it got better for a bit but started hurting again. Pt states that he lost his taste and smell for approx one day.

## 2021-04-05 NOTE — Discharge Instructions (Addendum)
Return to ED with any new or worsening symptoms such as fever, chest pain, shortness of breath, drooling You may take 600 to 800 mg of ibuprofen every 6 hours for chills and body aches. You may take it as milligrams of Tylenol every 6 hours for fevers Please continue to hydrate yourself while sick.  Gatorade or Pedialyte are good options.

## 2021-04-05 NOTE — ED Provider Notes (Signed)
[facility] Provider Note   CSN: [id number] Arrival date & time: 04/05/21  2202     History  Chief Complaint  Patient presents with   Sore Throat    Pedro Hebert is a 36 y.o. male with medical history of hypertension.  Patient presents to the ED today due to sore throat that he began experiencing on Thursday.  Patient states that he has not been vaccinated against the flu this year but has recovered.  Patient states since Thursday sore throat is gotten progressively worse and he is now experiencing chills.  The patient states he has not attempted to alleviate his symptoms utilizing any over-the-counter medication.  Patient denies any known sick contacts.  Patient denies nausea, vomiting, fevers, shortness of breath, chest pain, abdominal pain, diarrhea.   Sore Throat Pertinent negatives include no chest pain, no abdominal pain and no shortness of breath.      Home Medications Prior to Admission medications   Medication Sig Start Date End Date Taking? Authorizing Provider  amoxicillin (AMOXIL) 500 MG capsule Take 1 capsule (500 mg total) by mouth 3 (three) times daily. 07/27/20   Molpus, John, MD  clindamycin (CLEOCIN) 300 MG capsule Take 1 capsule (300 mg total) by mouth 4 (four) times daily. X 7 days 09/29/20   Veryl Speak, MD  ondansetron (ZOFRAN-ODT) 4 MG disintegrating tablet 4mg  ODT q4 hours prn nausea/vomit 03/02/21   Drenda Freeze, MD  oxyCODONE-acetaminophen (PERCOCET) 5-325 MG tablet Take 1-2 tablets by mouth every 6 (six) hours as needed. 09/29/20   Veryl Speak, MD  albuterol (PROVENTIL HFA;VENTOLIN HFA) 108 (90 Base) MCG/ACT inhaler Inhale 1-2 puffs into the lungs every 6 (six) hours as needed for wheezing or shortness of breath. 01/24/16 07/27/20  Palumbo, April, MD      Allergies    Patient has no known allergies.    Review of Systems   Review of Systems  Constitutional:  Positive for chills. Negative for fever.  HENT:   Positive for sore throat.   Respiratory:  Negative for cough and shortness of breath.   Cardiovascular:  Negative for chest pain.  Gastrointestinal:  Negative for abdominal pain, diarrhea, nausea and vomiting.  All other systems reviewed and are negative.  Physical Exam Updated Vital Signs BP 130/84 (BP Location: Right Arm)    Pulse 89    Temp 98.9 F (37.2 C) (Oral)    Resp 18    Ht 5\' 7"  (1.702 m)    Wt 63.5 kg    SpO2 99%    BMI 21.93 kg/m  Physical Exam Vitals and nursing note reviewed.  Constitutional:      General: He is not in acute distress.    Appearance: He is well-developed. He is not ill-appearing, toxic-appearing or diaphoretic.  HENT:     Head: Normocephalic and atraumatic.     Right Ear: Tympanic membrane normal.     Left Ear: Tympanic membrane normal.     Nose: No congestion.     Mouth/Throat:     Mouth: Mucous membranes are moist.     Pharynx: Uvula midline. Posterior oropharyngeal erythema present. No oropharyngeal exudate or uvula swelling.     Tonsils: No tonsillar exudate. 0 on the right. 0 on the left.  Eyes:     Conjunctiva/sclera: Conjunctivae normal.     Pupils: Pupils are equal, round, and reactive to light.  Neck:     Thyroid: No thyromegaly.  Cardiovascular:     Rate and Rhythm: Normal  rate and regular rhythm.  Pulmonary:     Effort: Pulmonary effort is normal.     Breath sounds: Normal breath sounds. No stridor. No wheezing, rhonchi or rales.  Abdominal:     General: Bowel sounds are normal. There is no distension.     Palpations: Abdomen is soft. There is no mass.     Tenderness: There is no abdominal tenderness.  Musculoskeletal:     Cervical back: Normal range of motion and neck supple.  Skin:    General: Skin is warm and dry.     Capillary Refill: Capillary refill takes less than 2 seconds.  Neurological:     Mental Status: He is alert.    ED Results / Procedures / Treatments   Labs (all labs ordered are listed, but only abnormal  results are displayed) Labs Reviewed  GROUP A STREP BY PCR  RESP PANEL BY RT-PCR (FLU A&B, COVID) ARPGX2    EKG None  Radiology No results found.  Procedures Procedures    Medications Ordered in ED Medications - No data to display  ED Course/ Medical Decision Making/ A&P                           Medical Decision Making  36 year old male presents due to sore throat since Thursday.  On examination, patient not hypoxic, afebrile, handling secretions, midline uvula, lung sounds clear to auscultation bilaterally, phonating appropriately  Patient worked up utilizing group A strep PCR, respiratory panel Respiratory panel negative for COVID and flu Group A strep is not positive  I personally ordered and interpreted these labs  At this time, patient work-up is been largely negative.  Most likely cause of patient sore throat is viral-like illness.  I provided the patient with recommendations regarding medications he can take to alleviate his symptomatic issues.  I have advised the patient to utilize warm salt water rinses to alleviate any sore throat.  I have provided the patient with return precautions and he voiced understanding.  I have answered all the patient's questions to his satisfaction.  The patient is stable on discharge   Final Clinical Impression(s) / ED Diagnoses Final diagnoses:  Sore throat    Rx / DC Orders ED Discharge Orders     None         Azucena Cecil, PA-C [id number]    Campbell Stall P, DO [id number]

## 2021-05-03 ENCOUNTER — Emergency Department (HOSPITAL_BASED_OUTPATIENT_CLINIC_OR_DEPARTMENT_OTHER): Payer: BC Managed Care – PPO

## 2021-05-03 ENCOUNTER — Encounter (HOSPITAL_BASED_OUTPATIENT_CLINIC_OR_DEPARTMENT_OTHER): Payer: Self-pay | Admitting: Urology

## 2021-05-03 ENCOUNTER — Other Ambulatory Visit: Payer: Self-pay

## 2021-05-03 ENCOUNTER — Emergency Department (HOSPITAL_BASED_OUTPATIENT_CLINIC_OR_DEPARTMENT_OTHER)
Admission: EM | Admit: 2021-05-03 | Discharge: 2021-05-03 | Disposition: A | Discharge status: Home / Self Care | Payer: BC Managed Care – PPO | Attending: Emergency Medicine | Admitting: Emergency Medicine

## 2021-05-03 DIAGNOSIS — R6883 Chills (without fever): Secondary | ICD-10-CM | POA: Insufficient documentation

## 2021-05-03 DIAGNOSIS — M791 Myalgia, unspecified site: Secondary | ICD-10-CM | POA: Insufficient documentation

## 2021-05-03 DIAGNOSIS — R059 Cough, unspecified: Secondary | ICD-10-CM | POA: Diagnosis present

## 2021-05-03 DIAGNOSIS — J029 Acute pharyngitis, unspecified: Secondary | ICD-10-CM | POA: Insufficient documentation

## 2021-05-03 DIAGNOSIS — B349 Viral infection, unspecified: Secondary | ICD-10-CM

## 2021-05-03 DIAGNOSIS — Z20822 Contact with and (suspected) exposure to covid-19: Secondary | ICD-10-CM | POA: Diagnosis not present

## 2021-05-03 LAB — GROUP A STREP BY PCR: Group A Strep by PCR: NOT DETECTED

## 2021-05-03 LAB — RESP PANEL BY RT-PCR (FLU A&B, COVID) ARPGX2
Influenza A by PCR: NEGATIVE
Influenza B by PCR: NEGATIVE
SARS Coronavirus 2 by RT PCR: NEGATIVE

## 2021-05-03 MED ORDER — IBUPROFEN 800 MG PO TABS
800.0000 mg | ORAL_TABLET | Freq: Once | ORAL | Status: AC
  Administered 2021-05-03: 800 mg via ORAL
  Filled 2021-05-03: qty 1

## 2021-05-03 NOTE — Discharge Instructions (Signed)
You likely have a viral illness.  Your COVID and flu test were negative and your x-ray did not show any pneumonia  Take Tylenol or Motrin for fever  Stay hydrated  See your doctor for follow-up  Return to ER if you have worse sore throat, trouble breathing, vomiting

## 2021-05-03 NOTE — ED Triage Notes (Signed)
States chills, sweats, body aches, cough, and fever x 5 days  Denies N/V

## 2021-05-03 NOTE — ED Notes (Signed)
Pt describes generalized bodo aches and fever x 5 days, no n/v

## 2021-05-03 NOTE — ED Provider Notes (Signed)
[facility] EMERGENCY DEPARTMENT Provider Note   CSN: [id number] Arrival date & time: 05/03/21  1752     History  No chief complaint on file.   Wolf Boulay is a 36 y.o. male here presenting with cough and chills and body aches.  Symptoms going on for the last 5 days.  Patient also has sore throat as well.  Denies any nausea vomiting or urinary symptoms.  The history is provided by the patient.      Home Medications Prior to Admission medications   Medication Sig Start Date End Date Taking? Authorizing Provider  amoxicillin (AMOXIL) 500 MG capsule Take 1 capsule (500 mg total) by mouth 3 (three) times daily. 07/27/20   Molpus, John, MD  clindamycin (CLEOCIN) 300 MG capsule Take 1 capsule (300 mg total) by mouth 4 (four) times daily. X 7 days 09/29/20   Geoffery Lyons, MD  ondansetron (ZOFRAN-ODT) 4 MG disintegrating tablet 4mg  ODT q4 hours prn nausea/vomit 03/02/21   03/04/21, MD  oxyCODONE-acetaminophen (PERCOCET) 5-325 MG tablet Take 1-2 tablets by mouth every 6 (six) hours as needed. 09/29/20   10/01/20, MD  albuterol (PROVENTIL HFA;VENTOLIN HFA) 108 (90 Base) MCG/ACT inhaler Inhale 1-2 puffs into the lungs every 6 (six) hours as needed for wheezing or shortness of breath. 01/24/16 07/27/20  Palumbo, April, MD      Allergies    Patient has no known allergies.    Review of Systems   Review of Systems  Constitutional:  Positive for chills.  HENT:  Positive for sore throat.   Respiratory:  Positive for cough.   All other systems reviewed and are negative.  Physical Exam Updated Vital Signs BP 124/80 (BP Location: Left Arm)    Pulse 87    Temp 99 F (37.2 C) (Oral)    Resp 18    Ht 5\' 7"  (1.702 m)    Wt 65.8 kg    SpO2 100%    BMI 22.71 kg/m  Physical Exam Vitals and nursing note reviewed.  Constitutional:      Appearance: Normal appearance.  HENT:     Head: Normocephalic.     Nose: Nose normal.     Mouth/Throat:     Comments: OP slightly red   Eyes:     Extraocular Movements: Extraocular movements intact.     Pupils: Pupils are equal, round, and reactive to light.  Cardiovascular:     Rate and Rhythm: Normal rate and regular rhythm.     Pulses: Normal pulses.     Heart sounds: Normal heart sounds.  Pulmonary:     Effort: Pulmonary effort is normal.     Breath sounds: Normal breath sounds.  Abdominal:     General: Abdomen is flat.     Palpations: Abdomen is soft.  Musculoskeletal:        General: Normal range of motion.     Cervical back: Normal range of motion and neck supple.  Skin:    General: Skin is warm.     Capillary Refill: Capillary refill takes less than 2 seconds.  Neurological:     General: No focal deficit present.     Mental Status: He is alert and oriented to person, place, and time.  Psychiatric:        Mood and Affect: Mood normal.        Behavior: Behavior normal.    ED Results / Procedures / Treatments   Labs (all labs ordered are listed, but only abnormal results  are displayed) Labs Reviewed  RESP PANEL BY RT-PCR (FLU A&B, COVID) ARPGX2  GROUP A STREP BY PCR    EKG None  Radiology DG Chest 2 View  Result Date: 05/03/2021 CLINICAL DATA:  Cough fever for several days EXAM: CHEST - 2 VIEW COMPARISON:  01/20/2016 FINDINGS: The heart size and mediastinal contours are within normal limits. Both lungs are clear. The visualized skeletal structures are unremarkable. IMPRESSION: No active cardiopulmonary disease. Electronically Signed   By: Alcide Clever M.D.   On: 05/03/2021 22:10    Procedures Procedures    Medications Ordered in ED Medications  ibuprofen (ADVIL) tablet 800 mg (800 mg Oral Given 05/03/21 2150)    ED Course/ Medical Decision Making/ A&P                           Medical Decision Making Osha Errico is a 36 y.o. male here presenting with chills and body aches.  Likely viral syndrome.  Patient's strep test and COVID and flu are negative.  Chest x-ray is clear.  Stable for  discharge    Amount and/or Complexity of Data Reviewed Labs: ordered. Decision-making details documented in ED Course. Radiology: ordered and independent interpretation performed. Decision-making details documented in ED Course.  Risk Prescription drug management.    Final Clinical Impression(s) / ED Diagnoses Final diagnoses:  None    Rx / DC Orders ED Discharge Orders     None         Charlynne Pander, MD 05/03/21 2305

## 2021-09-22 ENCOUNTER — Encounter (HOSPITAL_BASED_OUTPATIENT_CLINIC_OR_DEPARTMENT_OTHER): Payer: Self-pay | Admitting: Emergency Medicine

## 2021-09-22 ENCOUNTER — Emergency Department (HOSPITAL_BASED_OUTPATIENT_CLINIC_OR_DEPARTMENT_OTHER)

## 2021-09-22 DIAGNOSIS — W230XXA Caught, crushed, jammed, or pinched between moving objects, initial encounter: Secondary | ICD-10-CM | POA: Diagnosis not present

## 2021-09-22 DIAGNOSIS — S60012A Contusion of left thumb without damage to nail, initial encounter: Secondary | ICD-10-CM | POA: Diagnosis not present

## 2021-09-22 DIAGNOSIS — Y990 Civilian activity done for income or pay: Secondary | ICD-10-CM | POA: Diagnosis not present

## 2021-09-22 DIAGNOSIS — S6992XA Unspecified injury of left wrist, hand and finger(s), initial encounter: Secondary | ICD-10-CM | POA: Diagnosis present

## 2021-09-22 MED ORDER — IBUPROFEN 800 MG PO TABS
800.0000 mg | ORAL_TABLET | Freq: Once | ORAL | Status: AC | PRN
  Administered 2021-09-22: 800 mg via ORAL
  Filled 2021-09-22: qty 1

## 2021-09-22 NOTE — ED Triage Notes (Signed)
Pt reports left thumb injury at work.

## 2021-09-23 ENCOUNTER — Emergency Department (HOSPITAL_BASED_OUTPATIENT_CLINIC_OR_DEPARTMENT_OTHER)
Admission: EM | Admit: 2021-09-23 | Discharge: 2021-09-23 | Disposition: A | Discharge status: Home / Self Care | Attending: Emergency Medicine | Admitting: Emergency Medicine

## 2021-09-23 DIAGNOSIS — S6702XA Crushing injury of left thumb, initial encounter: Secondary | ICD-10-CM

## 2021-09-23 MED ORDER — OXYCODONE-ACETAMINOPHEN 5-325 MG PO TABS
1.0000 | ORAL_TABLET | Freq: Once | ORAL | Status: AC
  Administered 2021-09-23: 1 via ORAL
  Filled 2021-09-23: qty 1

## 2021-09-23 NOTE — ED Provider Notes (Signed)
[facility] EMERGENCY DEPARTMENT Provider Note   CSN: [id number] Arrival date & time: 09/22/21  2136     History  Chief Complaint  Patient presents with   Finger Injury    Pedro Hebert is a 36 y.o. male.  The history is provided by the patient.   Pedro Hebert is a 36 y.o. male who presents to the Emergency Department complaining of finger injury.  He presents emergency department for evaluation of injury to his left thumb that occurred around 10 PM.  He states that he was at work when his hand became crushed between a bender and  strapping material.  Pain is severe.  He cannot bend the left thumb.  He he is right-hand dominant.  No medical problems.  Tetanus updated in the last 3 years.  He did have some bleeding from the tip of his thumb.  Home Medications Prior to Admission medications   Medication Sig Start Date End Date Taking? Authorizing Provider  amoxicillin (AMOXIL) 500 MG capsule Take 1 capsule (500 mg total) by mouth 3 (three) times daily. 07/27/20   Molpus, John, MD  clindamycin (CLEOCIN) 300 MG capsule Take 1 capsule (300 mg total) by mouth 4 (four) times daily. X 7 days 09/29/20   Geoffery Lyons, MD  ondansetron (ZOFRAN-ODT) 4 MG disintegrating tablet 4mg  ODT q4 hours prn nausea/vomit 03/02/21   03/04/21, MD  oxyCODONE-acetaminophen (PERCOCET) 5-325 MG tablet Take 1-2 tablets by mouth every 6 (six) hours as needed. 09/29/20   10/01/20, MD  albuterol (PROVENTIL HFA;VENTOLIN HFA) 108 (90 Base) MCG/ACT inhaler Inhale 1-2 puffs into the lungs every 6 (six) hours as needed for wheezing or shortness of breath. 01/24/16 07/27/20  Palumbo, April, MD      Allergies    Patient has no known allergies.    Review of Systems   Review of Systems  Physical Exam Updated Vital Signs BP (!) 138/94 (BP Location: Right Arm)   Pulse 69   Temp 98.9 F (37.2 C) (Oral)   Resp 18   Ht 5\' 7"  (1.702 m)   Wt 68 kg   SpO2 100%   BMI 23.49 kg/m  Physical  Exam Vitals and nursing note reviewed.  Constitutional:      Appearance: He is well-developed.  HENT:     Head: Normocephalic and atraumatic.  Cardiovascular:     Rate and Rhythm: Normal rate and regular rhythm.  Pulmonary:     Effort: Pulmonary effort is normal. No respiratory distress.  Abdominal:     Tenderness: There is no rebound.  Musculoskeletal:     Comments: There is tenderness to palpation throughout the left first digit.  There is fullness and ecchymosis to the distal pad of the left thumb.  There is a tiny subungual hematoma.  Patient is unable to passively or actively flex at the IP joint.  Sensation to light touch is intact throughout the digit.  Punctate wound to the tip of the digit that is hemostatic  Skin:    General: Skin is warm and dry.  Neurological:     Mental Status: He is alert and oriented to person, place, and time.  Psychiatric:        Behavior: Behavior normal.     ED Results / Procedures / Treatments   Labs (all labs ordered are listed, but only abnormal results are displayed) Labs Reviewed - No data to display  EKG None  Radiology DG Finger Thumb Left  Result Date: 09/22/2021 CLINICAL DATA:  Left thumb injury EXAM: LEFT THUMB 2+V COMPARISON:  None Available. FINDINGS: No fracture or dislocation is seen. The joint spaces are preserved. Visualized soft tissues are within normal limits. IMPRESSION: Negative. Electronically Signed   By: Charline Bills M.D.   On: 09/22/2021 22:21    Procedures Procedures    Medications Ordered in ED Medications  ibuprofen (ADVIL) tablet 800 mg (800 mg Oral Given 09/22/21 2158)  oxyCODONE-acetaminophen (PERCOCET/ROXICET) 5-325 MG per tablet 1 tablet (1 tablet Oral Given 09/23/21 0311)  SPLINT APPLICATION Date/Time: 3:38 AM Authorized by: Tilden Fossa Consent: Verbal consent obtained. Risks and benefits: risks, benefits and alternatives were discussed Consent given by: patient Splint applied by: ED  technician Location details: LUE Splint type: thumb spica Supplies used: ortho glass, ace wrap Post-procedure: The splinted body part was neurovascularly unchanged following the procedure. Patient tolerance: Patient tolerated the procedure well with no immediate complications.    ED Course/ Medical Decision Making/ A&P                           Medical Decision Making Amount and/or Complexity of Data Reviewed Radiology: ordered.  Risk Prescription drug management.   Patient here for evaluation of crush injury to the left thumb.  Images are negative for acute fracture or dislocation.  There is a very small subungual hematoma, no benefit in trephination at this time.  Patient is unable to flex the digit actively or passively, cannot rule out tendon injury versus severe contusion.  Will place in splint with outpatient hand surgery follow-up and return precautions.        Final Clinical Impression(s) / ED Diagnoses Final diagnoses:  Crushing injury of left thumb, initial encounter    Rx / DC Orders ED Discharge Orders     None         Tilden Fossa, MD 09/23/21 [phone]

## 2021-09-23 NOTE — Discharge Instructions (Signed)
You may take Tylenol or ibuprofen, available over-the-counter according to label instructions as needed for pain.  Keep the splint clean and dry.  Please call to follow-up with a hand surgeon in the next several days.

## 2021-10-28 ENCOUNTER — Other Ambulatory Visit: Payer: Self-pay

## 2021-10-28 ENCOUNTER — Emergency Department (HOSPITAL_BASED_OUTPATIENT_CLINIC_OR_DEPARTMENT_OTHER)
Admission: EM | Admit: 2021-10-28 | Discharge: 2021-10-28 | Disposition: A | Discharge status: Home / Self Care | Payer: Self-pay | Attending: Emergency Medicine | Admitting: Emergency Medicine

## 2021-10-28 ENCOUNTER — Encounter (HOSPITAL_BASED_OUTPATIENT_CLINIC_OR_DEPARTMENT_OTHER): Payer: Self-pay | Admitting: Emergency Medicine

## 2021-10-28 DIAGNOSIS — I10 Essential (primary) hypertension: Secondary | ICD-10-CM | POA: Insufficient documentation

## 2021-10-28 DIAGNOSIS — K0889 Other specified disorders of teeth and supporting structures: Secondary | ICD-10-CM | POA: Insufficient documentation

## 2021-10-28 MED ORDER — PENICILLIN V POTASSIUM 250 MG PO TABS
500.0000 mg | ORAL_TABLET | Freq: Once | ORAL | Status: AC
  Administered 2021-10-28: 500 mg via ORAL
  Filled 2021-10-28: qty 2

## 2021-10-28 MED ORDER — KETOROLAC TROMETHAMINE 15 MG/ML IJ SOLN
15.0000 mg | Freq: Once | INTRAMUSCULAR | Status: AC
  Administered 2021-10-28: 15 mg via INTRAMUSCULAR
  Filled 2021-10-28: qty 1

## 2021-10-28 MED ORDER — BENZOCAINE 20 % MT GEL: 1.0000 | Freq: Four times a day (QID) | OROMUCOSAL | 0 refills | Status: AC | PRN

## 2021-10-28 MED ORDER — PENICILLIN V POTASSIUM 500 MG PO TABS: 500.0000 mg | ORAL_TABLET | Freq: Four times a day (QID) | ORAL | 0 refills | Status: AC

## 2021-10-28 MED ORDER — NAPROXEN 500 MG PO TABS: 500.0000 mg | ORAL_TABLET | Freq: Two times a day (BID) | ORAL | 0 refills | Status: AC

## 2021-10-28 NOTE — Discharge Instructions (Addendum)
It was a pleasure taking care of you today.  As discussed, I suspect your pain is related to exposed roots.  I am sending you home with pain medication, antibiotics, and numbing gel.  Use as prescribed.  I have included dental resources in the community.  Please call tomorrow to schedule an appointment for further evaluation.  Return to the ER for new or worsening symptoms.

## 2021-10-28 NOTE — ED Provider Notes (Signed)
[facility] EMERGENCY DEPARTMENT Provider Note   CSN: [id number] Arrival date & time: 10/28/21  1502     History  Chief Complaint  Patient presents with   Dental Pain    Pedro Hebert is a 35 y.o. male with a past medical history significant for hypertension who presents to the ED due to left lower dental pain x3 days.  Patient notes he last saw a dentist roughly a few months ago.  He endorses "feeling hot" when he is experiencing dental pain.  Denies changes to phonation.  No difficulties breathing or swallowing.  No facial edema.  Denies trismus.  He has been taking over-the-counter ibuprofen and Tylenol without relief.   History obtained from patient and past medical records. No interpreter used during encounter.       Home Medications Prior to Admission medications   Medication Sig Start Date End Date Taking? Authorizing Provider  benzocaine (HURRICAINE) 20 % GEL Use as directed 1 Application in the mouth or throat 4 (four) times daily as needed. 10/28/21  Yes Kelsy Polack C, PA-C  naproxen (NAPROSYN) 500 MG tablet Take 1 tablet (500 mg total) by mouth 2 (two) times daily. 10/28/21  Yes Meerab Maselli C, PA-C  penicillin v potassium (VEETID) 500 MG tablet Take 1 tablet (500 mg total) by mouth 4 (four) times daily for 7 days. 10/28/21 11/04/21 Yes Timithy Arons, Merla Riches, PA-C  amoxicillin (AMOXIL) 500 MG capsule Take 1 capsule (500 mg total) by mouth 3 (three) times daily. 07/27/20   Molpus, John, MD  clindamycin (CLEOCIN) 300 MG capsule Take 1 capsule (300 mg total) by mouth 4 (four) times daily. X 7 days 09/29/20   Geoffery Lyons, MD  ondansetron (ZOFRAN-ODT) 4 MG disintegrating tablet 4mg  ODT q4 hours prn nausea/vomit 03/02/21   03/04/21, MD  oxyCODONE-acetaminophen (PERCOCET) 5-325 MG tablet Take 1-2 tablets by mouth every 6 (six) hours as needed. 09/29/20   10/01/20, MD  albuterol (PROVENTIL HFA;VENTOLIN HFA) 108 (90 Base) MCG/ACT inhaler Inhale 1-2  puffs into the lungs every 6 (six) hours as needed for wheezing or shortness of breath. 01/24/16 07/27/20  Palumbo, April, MD      Allergies    Patient has no known allergies.    Review of Systems   Review of Systems  Constitutional:  Positive for fever ("feeling hot").  HENT:  Positive for dental problem. Negative for ear pain and facial swelling.     Physical Exam Updated Vital Signs BP (!) 129/96 (BP Location: Left Arm)   Pulse 83   Temp 98 F (36.7 C)   Resp 16   Ht 5\' 8"  (1.727 m)   Wt 63.5 kg   SpO2 98%   BMI 21.29 kg/m  Physical Exam Vitals and nursing note reviewed.  Constitutional:      General: He is not in acute distress.    Appearance: He is not ill-appearing.  HENT:     Head: Normocephalic.     Mouth/Throat:     Comments: Two chipped lower left posterior molars with exposed roots.  No trismus.  Normal phonation.  Patient tolerating oral secretions without difficulty.  No tenderness below tongue.  Tongue in normal position without protrusion.  No facial edema. Eyes:     Pupils: Pupils are equal, round, and reactive to light.  Cardiovascular:     Rate and Rhythm: Normal rate and regular rhythm.     Pulses: Normal pulses.     Heart sounds: Normal heart sounds. No murmur  heard.    No friction rub. No gallop.  Pulmonary:     Effort: Pulmonary effort is normal.     Breath sounds: Normal breath sounds.  Abdominal:     General: Abdomen is flat. There is no distension.     Palpations: Abdomen is soft.     Tenderness: There is no abdominal tenderness. There is no guarding or rebound.  Musculoskeletal:        General: Normal range of motion.     Cervical back: Neck supple.  Skin:    General: Skin is warm and dry.  Neurological:     General: No focal deficit present.     Mental Status: He is alert.  Psychiatric:        Mood and Affect: Mood normal.        Behavior: Behavior normal.     ED Results / Procedures / Treatments   Labs (all labs ordered are  listed, but only abnormal results are displayed) Labs Reviewed - No data to display  EKG None  Radiology No results found.  Procedures Procedures    Medications Ordered in ED Medications  ketorolac (TORADOL) 15 MG/ML injection 15 mg (has no administration in time range)  penicillin v potassium (VEETID) tablet 500 mg (has no administration in time range)    ED Course/ Medical Decision Making/ A&P                           Medical Decision Making Risk OTC drugs. Prescription drug management.   36 year old male presents to the ED due to left lower dental pain x3 days.  Last saw a dentist roughly a few months ago.  Upon arrival, stable vitals.  Patient in no acute distress.  Physical exam significant for 2 chipped left lower posterior molars with exposed roots.  No trismus.  Normal phonation.  Tongue in normal position without protrusion.  No facial edema.  Airway patent.  No signs of respiratory distress.  Low suspicion for Ludwig's or deep space infection.  Patient treated with Toradol and antibiotics here in the ED.  Discharged with naproxen, antibiotics, and dental resources. Strict ED precautions discussed with patient. Patient states understanding and agrees to plan. Patient discharged home in no acute distress and stable vitals        Final Clinical Impression(s) / ED Diagnoses Final diagnoses:  Pain, dental    Rx / DC Orders ED Discharge Orders          Ordered    naproxen (NAPROSYN) 500 MG tablet  2 times daily        10/28/21 1734    penicillin v potassium (VEETID) 500 MG tablet  4 times daily        10/28/21 1734    benzocaine (HURRICAINE) 20 % GEL  4 times daily PRN        10/28/21 1735              Mannie Stabile, PA-C 10/28/21 1736    Melene Plan, DO 10/28/21 1921

## 2021-10-28 NOTE — ED Triage Notes (Signed)
L lower back dental pain. Pt reports his gums are swollen and "I keep getting feverish". No facial swelling noted.

## 2022-03-01 ENCOUNTER — Emergency Department (HOSPITAL_BASED_OUTPATIENT_CLINIC_OR_DEPARTMENT_OTHER)
Admission: EM | Admit: 2022-03-01 | Discharge: 2022-03-01 | Disposition: A | Discharge status: Home / Self Care | Payer: Self-pay | Attending: Emergency Medicine | Admitting: Emergency Medicine

## 2022-03-01 ENCOUNTER — Encounter (HOSPITAL_BASED_OUTPATIENT_CLINIC_OR_DEPARTMENT_OTHER): Payer: Self-pay | Admitting: Emergency Medicine

## 2022-03-01 ENCOUNTER — Other Ambulatory Visit: Payer: Self-pay

## 2022-03-01 DIAGNOSIS — K0889 Other specified disorders of teeth and supporting structures: Secondary | ICD-10-CM | POA: Insufficient documentation

## 2022-03-01 DIAGNOSIS — I10 Essential (primary) hypertension: Secondary | ICD-10-CM | POA: Insufficient documentation

## 2022-03-01 MED ORDER — PENICILLIN V POTASSIUM 250 MG PO TABS
500.0000 mg | ORAL_TABLET | Freq: Once | ORAL | Status: AC
  Administered 2022-03-01: 500 mg via ORAL
  Filled 2022-03-01: qty 2

## 2022-03-01 MED ORDER — PENICILLIN V POTASSIUM 500 MG PO TABS: 500.0000 mg | ORAL_TABLET | Freq: Four times a day (QID) | ORAL | 0 refills | Status: AC

## 2022-03-01 MED ORDER — NAPROXEN 500 MG PO TABS: 500.0000 mg | ORAL_TABLET | Freq: Two times a day (BID) | ORAL | 0 refills | Status: AC

## 2022-03-01 MED ORDER — PENICILLIN V POTASSIUM 125 MG/5ML PO SOLR: 125.0000 mg | Freq: Once | ORAL | Status: DC

## 2022-03-01 MED ORDER — KETOROLAC TROMETHAMINE 15 MG/ML IJ SOLN
15.0000 mg | Freq: Once | INTRAMUSCULAR | Status: AC
  Administered 2022-03-01: 15 mg via INTRAMUSCULAR
  Filled 2022-03-01: qty 1

## 2022-03-01 NOTE — ED Provider Notes (Signed)
[facility] EMERGENCY DEPARTMENT Provider Note   CSN: [id number] Arrival date & time: 03/01/22  8841     History  Chief Complaint  Patient presents with   Dental Problem    Pedro Hebert is a 36 y.o. male with a past medical history significant for hypertension who presents to the ED due to dental pain that has been ongoing for numerous months.  Patient seen in August 2023 for same complaint.  Patient states after his ED visit he was unable to visit a dentist due to financial restraints.  Patient states pain has worsened over the past week.  Denies facial edema.  No change in phonation, trismus, fever, or chills. Unsure when he last saw a dentist. He has been taking ibuprofen with mild relief.   History obtained from patient and past medical records. No interpreter used during encounter.       Home Medications Prior to Admission medications   Medication Sig Start Date End Date Taking? Authorizing Provider  naproxen (NAPROSYN) 500 MG tablet Take 1 tablet (500 mg total) by mouth 2 (two) times daily. 03/01/22  Yes Mickle Campton, [place] Citizen C, PA-C  penicillin v potassium (VEETID) 500 MG tablet Take 1 tablet (500 mg total) by mouth 4 (four) times daily for 7 days. 03/01/22 03/08/22 Yes Severo Beber, Merla Riches, PA-C  amoxicillin (AMOXIL) 500 MG capsule Take 1 capsule (500 mg total) by mouth 3 (three) times daily. 07/27/20   Molpus, John, MD  benzocaine (HURRICAINE) 20 % GEL Use as directed 1 Application in the mouth or throat 4 (four) times daily as needed. 10/28/21   Mannie Stabile, PA-C  clindamycin (CLEOCIN) 300 MG capsule Take 1 capsule (300 mg total) by mouth 4 (four) times daily. X 7 days 09/29/20   Geoffery Lyons, MD  naproxen (NAPROSYN) 500 MG tablet Take 1 tablet (500 mg total) by mouth 2 (two) times daily. 10/28/21   Mannie Stabile, PA-C  ondansetron (ZOFRAN-ODT) 4 MG disintegrating tablet 4mg  ODT q4 hours prn nausea/vomit 03/02/21   03/04/21, MD   oxyCODONE-acetaminophen (PERCOCET) 5-325 MG tablet Take 1-2 tablets by mouth every 6 (six) hours as needed. 09/29/20   10/01/20, MD  albuterol (PROVENTIL HFA;VENTOLIN HFA) 108 (90 Base) MCG/ACT inhaler Inhale 1-2 puffs into the lungs every 6 (six) hours as needed for wheezing or shortness of breath. 01/24/16 07/27/20  Palumbo, April, MD      Allergies    Patient has no known allergies.    Review of Systems   Review of Systems  Constitutional:  Negative for chills and fever.  HENT:  Positive for dental problem. Negative for facial swelling, trouble swallowing and voice change.   Respiratory:  Negative for shortness of breath.   Cardiovascular:  Negative for chest pain.  All other systems reviewed and are negative.   Physical Exam Updated Vital Signs BP 134/89   Pulse 67   Temp 98.5 F (36.9 C)   Resp 18   Ht 5\' 7"  (1.702 m)   Wt 65.8 kg   SpO2 100%   BMI 22.71 kg/m  Physical Exam Vitals and nursing note reviewed.  Constitutional:      General: He is not in acute distress.    Appearance: He is not ill-appearing.  HENT:     Head: Normocephalic.     Mouth/Throat:     Comments: Poor dentition throughout.  Numerous missing teeth with exposed root.  Tongue in normal position without protrusion.  No facial edema.  Normal phonation.  Tolerating oral secretions without difficulty.  No trismus. Eyes:     Pupils: Pupils are equal, round, and reactive to light.  Cardiovascular:     Rate and Rhythm: Normal rate and regular rhythm.     Pulses: Normal pulses.     Heart sounds: Normal heart sounds. No murmur heard.    No friction rub. No gallop.  Pulmonary:     Effort: Pulmonary effort is normal.     Breath sounds: Normal breath sounds.  Abdominal:     General: Abdomen is flat. There is no distension.     Palpations: Abdomen is soft.     Tenderness: There is no abdominal tenderness. There is no guarding or rebound.  Musculoskeletal:        General: Normal range of motion.      Cervical back: Neck supple.  Skin:    General: Skin is warm and dry.  Neurological:     General: No focal deficit present.     Mental Status: He is alert.  Psychiatric:        Mood and Affect: Mood normal.        Behavior: Behavior normal.     ED Results / Procedures / Treatments   Labs (all labs ordered are listed, but only abnormal results are displayed) Labs Reviewed - No data to display  EKG None  Radiology No results found.  Procedures Procedures    Medications Ordered in ED Medications  penicillin v potassium (VEETID) tablet 500 mg (has no administration in time range)  ketorolac (TORADOL) 15 MG/ML injection 15 mg (15 mg Intramuscular Given 03/01/22 2706)    ED Course/ Medical Decision Making/ A&P                           Medical Decision Making Risk Prescription drug management.   36 year old male presents to the ED due to dental pain for numerous months.  Seen for the same on August 2023.  Unable to visit a dentist due to financial restraints.  Upon arrival, vitals all within normal limits.  Patient in no acute distress.  Poor dentition throughout with numerous missing teeth and exposed root.  No facial edema, trismus, changes to phonation.  Patient tolerating oral secretions without difficulty. Low suspicion for ludwig's or deep space infection. Patient given Toradol and Pen-VK here in the ED.  Discharged with naproxen and Pen-VK.  GoodRx card given to patient at discharge to help with prescription prices.  Dental resources given to patient at discharge.  I had a long discussion with patient in regards to need to see dentist for further evaluation of dental pain. Patient stable for discharge. Strict ED precautions discussed with patient. Patient states understanding and agrees to plan. Patient discharged home in no acute distress and stable vitals  No PCP, financial restraints       Final Clinical Impression(s) / ED Diagnoses Final diagnoses:  Pain,  dental    Rx / DC Orders ED Discharge Orders          Ordered    naproxen (NAPROSYN) 500 MG tablet  2 times daily        03/01/22 0923    penicillin v potassium (VEETID) 500 MG tablet  4 times daily        03/01/22 0923              Mannie Stabile, PA-C 03/01/22 0926    Lonell Grandchild, MD 03/01/22 1616

## 2022-03-01 NOTE — ED Notes (Signed)
ED Provider at bedside. 

## 2022-03-01 NOTE — Discharge Instructions (Signed)
It was a pleasure taking care of you today.  As discussed, you will need to see a dentist for further evaluation of your dental pain.  I have included dental resources in the community. Please call today to schedule an appointment for further evaluation.  I am sending you home with antibiotics and pain medication.  I have also included a good Rx card to help with price of prescriptions.  Return to the ER for new or worsening symptoms.

## 2022-03-01 NOTE — ED Triage Notes (Signed)
Pt arrives pov, slow gait c/o dental pain, bilateral upper pain, LT lower pain x "months".

## 2022-11-01 IMAGING — CR DG CHEST 2V
2 series · 2 of 2 positions shown · non-contrast
Comparison: 01/20/2016

CLINICAL DATA: Cough fever for several days

EXAM:
CHEST - 2 VIEW

[w chest pa]
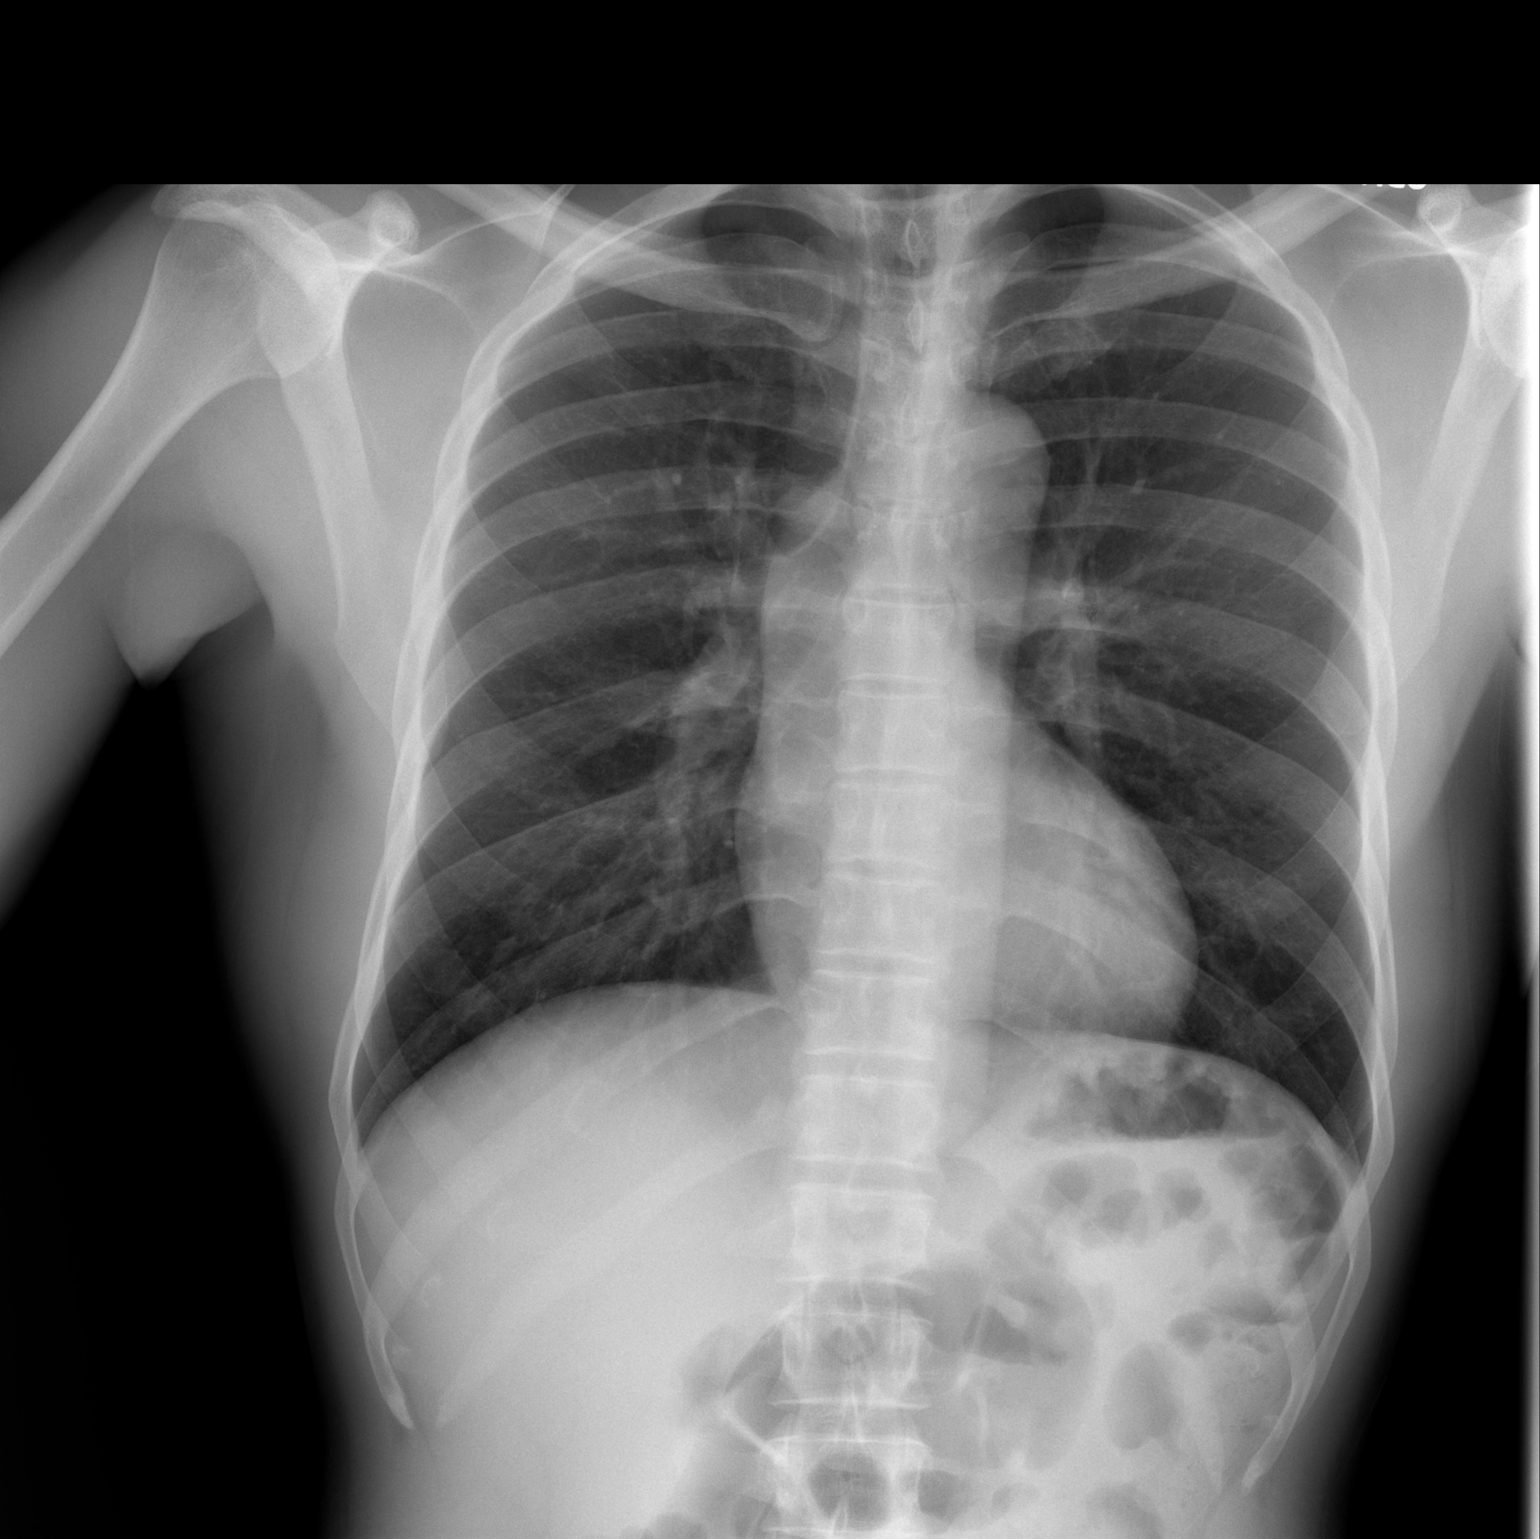

[w chest lat]
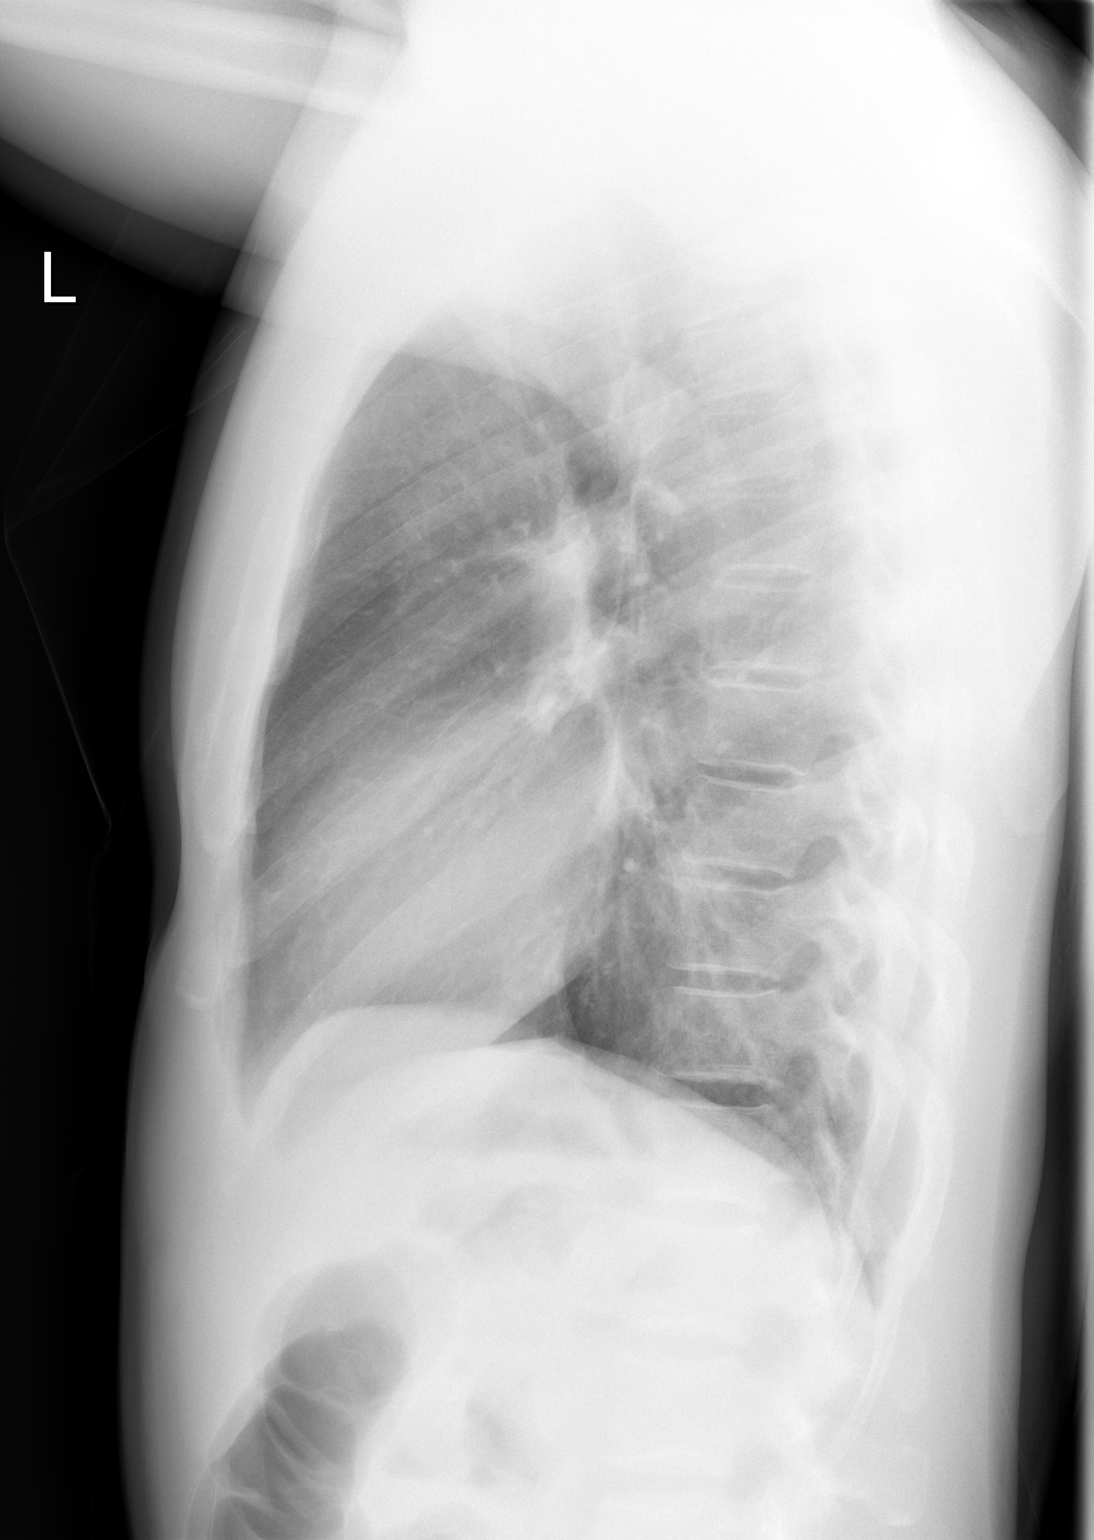

[2 of 2 positions shown; findings below may reference images not displayed]

FINDINGS: The heart size and mediastinal contours are within normal limits.
Both lungs are clear. The visualized skeletal structures are
unremarkable.
IMPRESSION: No active cardiopulmonary disease.

## 2023-05-18 ENCOUNTER — Emergency Department (HOSPITAL_BASED_OUTPATIENT_CLINIC_OR_DEPARTMENT_OTHER)
Admission: EM | Admit: 2023-05-18 | Discharge: 2023-05-18 | Disposition: A | Discharge status: Home / Self Care | Payer: Self-pay | Attending: Emergency Medicine | Admitting: Emergency Medicine

## 2023-05-18 ENCOUNTER — Other Ambulatory Visit: Payer: Self-pay

## 2023-05-18 ENCOUNTER — Emergency Department (HOSPITAL_BASED_OUTPATIENT_CLINIC_OR_DEPARTMENT_OTHER): Payer: Self-pay

## 2023-05-18 ENCOUNTER — Encounter (HOSPITAL_BASED_OUTPATIENT_CLINIC_OR_DEPARTMENT_OTHER): Payer: Self-pay

## 2023-05-18 DIAGNOSIS — H209 Unspecified iridocyclitis: Secondary | ICD-10-CM | POA: Insufficient documentation

## 2023-05-18 DIAGNOSIS — H538 Other visual disturbances: Secondary | ICD-10-CM

## 2023-05-18 DIAGNOSIS — I10 Essential (primary) hypertension: Secondary | ICD-10-CM | POA: Insufficient documentation

## 2023-05-18 DIAGNOSIS — H53141 Visual discomfort, right eye: Secondary | ICD-10-CM | POA: Insufficient documentation

## 2023-05-18 DIAGNOSIS — Z79899 Other long term (current) drug therapy: Secondary | ICD-10-CM | POA: Insufficient documentation

## 2023-05-18 LAB — BASIC METABOLIC PANEL
Anion gap: 6 (ref 5–15)
BUN: 13 mg/dL (ref 6–20)
CO2: 25 mmol/L (ref 22–32)
Calcium: 8.8 mg/dL — ABNORMAL LOW (ref 8.9–10.3)
Chloride: 108 mmol/L (ref 98–111)
Creatinine, Ser: 0.97 mg/dL (ref 0.61–1.24)
GFR, Estimated: >60 mL/min (ref >60)
Glucose, Bld: 103 mg/dL — ABNORMAL HIGH (ref 70–99)
Potassium: 4.5 mmol/L (ref 3.5–5.1)
Sodium: 139 mmol/L (ref 135–145)

## 2023-05-18 LAB — CBC
HCT: 39.7 % (ref 39.0–52.0)
Hemoglobin: 13.5 g/dL (ref 13.0–17.0)
MCH: 31.2 pg (ref 26.0–34.0)
MCHC: 34 g/dL (ref 30.0–36.0)
MCV: 91.7 fL (ref 80.0–100.0)
Platelets: 211 10*3/uL (ref 150–400)
RBC: 4.33 MIL/uL (ref 4.22–5.81)
RDW: 13.1 % (ref 11.5–15.5)
WBC: 7.3 10*3/uL (ref 4.0–10.5)
nRBC: 0 % (ref 0.0–0.2)

## 2023-05-18 MED ORDER — OXYCODONE-ACETAMINOPHEN 5-325 MG PO TABS
1.0000 | ORAL_TABLET | Freq: Once | ORAL | Status: AC
  Administered 2023-05-18: 1 via ORAL
  Filled 2023-05-18: qty 1

## 2023-05-18 MED ORDER — TETRACAINE HCL 0.5 % OP SOLN
2.0000 [drp] | Freq: Once | OPHTHALMIC | Status: AC
  Administered 2023-05-18: 2 [drp] via OPHTHALMIC
  Filled 2023-05-18: qty 4

## 2023-05-18 MED ORDER — IOHEXOL 300 MG/ML  SOLN
100.0000 mL | Freq: Once | INTRAMUSCULAR | Status: AC | PRN
Start: 2023-05-18 — End: 2023-05-18
  Administered 2023-05-18: 75 mL via INTRAVENOUS

## 2023-05-18 MED ORDER — FLUORESCEIN SODIUM 1 MG OP STRP
1.0000 | ORAL_STRIP | Freq: Once | OPHTHALMIC | Status: AC
  Administered 2023-05-18: 1 via OPHTHALMIC
  Filled 2023-05-18: qty 1

## 2023-05-18 NOTE — ED Notes (Signed)
 Visual acuity completed per order. Pt does not wear glasses or contacts. States vision in right eye is very blurry and watery, and slighly blurry in his left.

## 2023-05-18 NOTE — Discharge Instructions (Addendum)
(  [phone]) [address] [phone], [place], [place]

## 2023-05-18 NOTE — ED Notes (Signed)
 ED Provider at bedside.

## 2023-05-18 NOTE — ED Triage Notes (Signed)
 Pt reports that his right eye has been hurting since Dec. States that his eye is constantly watering. Says it feels like there is something in it.
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
## 2023-11-23 ENCOUNTER — Other Ambulatory Visit: Payer: Self-pay

## 2023-11-23 ENCOUNTER — Encounter (HOSPITAL_BASED_OUTPATIENT_CLINIC_OR_DEPARTMENT_OTHER): Payer: Self-pay

## 2023-11-23 DIAGNOSIS — H5789 Other specified disorders of eye and adnexa: Secondary | ICD-10-CM | POA: Insufficient documentation

## 2023-11-23 DIAGNOSIS — Z5321 Procedure and treatment not carried out due to patient leaving prior to being seen by health care provider: Secondary | ICD-10-CM | POA: Insufficient documentation

## 2023-11-23 NOTE — ED Triage Notes (Addendum)
 Pt states his rt. eye has been red and swollen x 4 days Pt has recurrent infections in this eye Pt states swelling has gone down, redness continues

## 2023-11-24 ENCOUNTER — Emergency Department (HOSPITAL_BASED_OUTPATIENT_CLINIC_OR_DEPARTMENT_OTHER)
Admission: EM | Admit: 2023-11-24 | Discharge: 2023-11-24 | Discharge status: Against Medical Advice | Payer: Self-pay | Attending: Emergency Medicine | Admitting: Emergency Medicine
